# Patient Record
Sex: Female | Born: 1950 | Race: White | Hispanic: No | Smoking: Former smoker
Health system: Southern US, Community
[De-identification: ages and names within clinical notes are randomized; demographics above are authoritative.]

## PROBLEM LIST (undated history)

## (undated) DIAGNOSIS — I10 Essential (primary) hypertension: Secondary | ICD-10-CM

## (undated) DIAGNOSIS — F988 Other specified behavioral and emotional disorders with onset usually occurring in childhood and adolescence: Secondary | ICD-10-CM

## (undated) DIAGNOSIS — Z8489 Family history of other specified conditions: Secondary | ICD-10-CM

## (undated) DIAGNOSIS — M8580 Other specified disorders of bone density and structure, unspecified site: Secondary | ICD-10-CM

## (undated) DIAGNOSIS — Z87410 Personal history of cervical dysplasia: Secondary | ICD-10-CM

## (undated) DIAGNOSIS — F419 Anxiety disorder, unspecified: Secondary | ICD-10-CM

## (undated) DIAGNOSIS — N879 Dysplasia of cervix uteri, unspecified: Secondary | ICD-10-CM

## (undated) HISTORY — DX: Other specified disorders of bone density and structure, unspecified site: M85.80

## (undated) HISTORY — PX: BREAST SURGERY: SHX581

## (undated) HISTORY — PX: APPENDECTOMY: SHX54

## (undated) HISTORY — PX: GYNECOLOGIC CRYOSURGERY: SHX857

## (undated) HISTORY — DX: Anxiety disorder, unspecified: F41.9

## (undated) HISTORY — DX: Other specified behavioral and emotional disorders with onset usually occurring in childhood and adolescence: F98.8

## (undated) HISTORY — DX: Dysplasia of cervix uteri, unspecified: N87.9

## (undated) HISTORY — PX: HERNIA REPAIR: SHX51

## (undated) HISTORY — DX: Essential (primary) hypertension: I10

## (undated) HISTORY — PX: BREAST EXCISIONAL BIOPSY: SUR124

## (undated) HISTORY — PX: COLPOSCOPY: SHX161

---

## 1998-09-30 ENCOUNTER — Encounter (INDEPENDENT_AMBULATORY_CARE_PROVIDER_SITE_OTHER): Payer: Self-pay | Admitting: Specialist

## 1998-09-30 ENCOUNTER — Other Ambulatory Visit: Admission: RE | Admit: 1998-09-30 | Discharge: 1998-09-30 | Payer: Self-pay | Admitting: Obstetrics and Gynecology

## 1999-06-30 ENCOUNTER — Encounter: Admission: RE | Admit: 1999-06-30 | Discharge: 1999-06-30 | Payer: Self-pay | Admitting: Obstetrics and Gynecology

## 1999-06-30 ENCOUNTER — Encounter: Payer: Self-pay | Admitting: Obstetrics and Gynecology

## 1999-08-08 ENCOUNTER — Encounter: Admission: RE | Admit: 1999-08-08 | Discharge: 1999-08-08 | Payer: Self-pay | Admitting: Urology

## 1999-08-08 ENCOUNTER — Encounter: Payer: Self-pay | Admitting: Urology

## 2000-04-06 ENCOUNTER — Other Ambulatory Visit: Admission: RE | Admit: 2000-04-06 | Discharge: 2000-04-06 | Payer: Self-pay | Admitting: Obstetrics and Gynecology

## 2000-04-06 ENCOUNTER — Encounter: Admission: RE | Admit: 2000-04-06 | Discharge: 2000-04-06 | Payer: Self-pay | Admitting: Obstetrics and Gynecology

## 2000-04-06 ENCOUNTER — Encounter: Payer: Self-pay | Admitting: Obstetrics and Gynecology

## 2000-04-06 ENCOUNTER — Encounter (INDEPENDENT_AMBULATORY_CARE_PROVIDER_SITE_OTHER): Payer: Self-pay | Admitting: Specialist

## 2000-10-01 ENCOUNTER — Encounter: Admission: RE | Admit: 2000-10-01 | Discharge: 2000-10-01 | Payer: Self-pay | Admitting: Obstetrics and Gynecology

## 2000-10-01 ENCOUNTER — Encounter: Payer: Self-pay | Admitting: Obstetrics and Gynecology

## 2000-10-09 ENCOUNTER — Encounter (INDEPENDENT_AMBULATORY_CARE_PROVIDER_SITE_OTHER): Payer: Self-pay | Admitting: Specialist

## 2000-10-09 ENCOUNTER — Encounter: Admission: RE | Admit: 2000-10-09 | Discharge: 2000-10-09 | Payer: Self-pay | Admitting: General Surgery

## 2000-10-09 ENCOUNTER — Ambulatory Visit (HOSPITAL_BASED_OUTPATIENT_CLINIC_OR_DEPARTMENT_OTHER): Admission: RE | Admit: 2000-10-09 | Discharge: 2000-10-09 | Payer: Self-pay | Admitting: General Surgery

## 2000-10-09 ENCOUNTER — Encounter: Payer: Self-pay | Admitting: General Surgery

## 2001-04-30 ENCOUNTER — Encounter: Payer: Self-pay | Admitting: Obstetrics and Gynecology

## 2001-04-30 ENCOUNTER — Encounter: Admission: RE | Admit: 2001-04-30 | Discharge: 2001-04-30 | Payer: Self-pay | Admitting: Obstetrics and Gynecology

## 2002-05-05 ENCOUNTER — Other Ambulatory Visit: Admission: RE | Admit: 2002-05-05 | Discharge: 2002-05-05 | Payer: Self-pay | Admitting: Obstetrics and Gynecology

## 2002-05-13 ENCOUNTER — Ambulatory Visit (HOSPITAL_COMMUNITY): Admission: RE | Admit: 2002-05-13 | Discharge: 2002-05-13 | Payer: Self-pay | Admitting: Obstetrics and Gynecology

## 2002-05-13 ENCOUNTER — Encounter: Payer: Self-pay | Admitting: Obstetrics and Gynecology

## 2002-08-21 ENCOUNTER — Encounter: Admission: RE | Admit: 2002-08-21 | Discharge: 2002-08-21 | Payer: Self-pay | Admitting: Obstetrics and Gynecology

## 2002-08-21 ENCOUNTER — Encounter: Payer: Self-pay | Admitting: Obstetrics and Gynecology

## 2003-01-12 ENCOUNTER — Encounter: Payer: Self-pay | Admitting: Family Medicine

## 2003-01-12 ENCOUNTER — Encounter: Admission: RE | Admit: 2003-01-12 | Discharge: 2003-01-12 | Payer: Self-pay | Admitting: Family Medicine

## 2003-05-18 ENCOUNTER — Other Ambulatory Visit: Admission: RE | Admit: 2003-05-18 | Discharge: 2003-05-18 | Payer: Self-pay | Admitting: Obstetrics and Gynecology

## 2003-07-22 ENCOUNTER — Other Ambulatory Visit: Admission: RE | Admit: 2003-07-22 | Discharge: 2003-07-22 | Payer: Self-pay | Admitting: Obstetrics and Gynecology

## 2003-09-07 ENCOUNTER — Encounter: Admission: RE | Admit: 2003-09-07 | Discharge: 2003-09-07 | Payer: Self-pay | Admitting: Obstetrics and Gynecology

## 2004-05-24 ENCOUNTER — Other Ambulatory Visit: Admission: RE | Admit: 2004-05-24 | Discharge: 2004-05-24 | Payer: Self-pay | Admitting: Obstetrics and Gynecology

## 2004-10-21 ENCOUNTER — Encounter: Admission: RE | Admit: 2004-10-21 | Discharge: 2004-10-21 | Payer: Self-pay | Admitting: Obstetrics and Gynecology

## 2005-09-13 ENCOUNTER — Other Ambulatory Visit: Admission: RE | Admit: 2005-09-13 | Discharge: 2005-09-13 | Payer: Self-pay | Admitting: Obstetrics and Gynecology

## 2006-01-02 ENCOUNTER — Encounter: Admission: RE | Admit: 2006-01-02 | Discharge: 2006-01-02 | Payer: Self-pay | Admitting: Obstetrics and Gynecology

## 2006-02-18 ENCOUNTER — Emergency Department (HOSPITAL_COMMUNITY): Admission: EM | Admit: 2006-02-18 | Discharge: 2006-02-18 | Payer: Self-pay | Admitting: Emergency Medicine

## 2006-09-20 ENCOUNTER — Other Ambulatory Visit: Admission: RE | Admit: 2006-09-20 | Discharge: 2006-09-20 | Payer: Self-pay | Admitting: Obstetrics and Gynecology

## 2006-11-08 ENCOUNTER — Inpatient Hospital Stay (HOSPITAL_COMMUNITY): Admission: EM | Admit: 2006-11-08 | Discharge: 2006-11-09 | Payer: Self-pay | Admitting: Emergency Medicine

## 2006-11-08 ENCOUNTER — Ambulatory Visit: Payer: Self-pay | Admitting: Internal Medicine

## 2006-11-09 ENCOUNTER — Encounter (INDEPENDENT_AMBULATORY_CARE_PROVIDER_SITE_OTHER): Payer: Self-pay | Admitting: Internal Medicine

## 2006-12-22 ENCOUNTER — Emergency Department (HOSPITAL_COMMUNITY): Admission: EM | Admit: 2006-12-22 | Discharge: 2006-12-22 | Payer: Self-pay | Admitting: Emergency Medicine

## 2007-02-14 ENCOUNTER — Encounter: Admission: RE | Admit: 2007-02-14 | Discharge: 2007-02-14 | Payer: Self-pay | Admitting: Obstetrics and Gynecology

## 2007-10-16 ENCOUNTER — Other Ambulatory Visit: Admission: RE | Admit: 2007-10-16 | Discharge: 2007-10-16 | Payer: Self-pay | Admitting: Obstetrics and Gynecology

## 2008-03-06 ENCOUNTER — Encounter: Admission: RE | Admit: 2008-03-06 | Discharge: 2008-03-06 | Payer: Self-pay | Admitting: Obstetrics and Gynecology

## 2008-11-02 ENCOUNTER — Ambulatory Visit: Payer: Self-pay | Admitting: Obstetrics and Gynecology

## 2008-11-02 ENCOUNTER — Encounter: Payer: Self-pay | Admitting: Obstetrics and Gynecology

## 2008-11-02 ENCOUNTER — Other Ambulatory Visit: Admission: RE | Admit: 2008-11-02 | Discharge: 2008-11-02 | Payer: Self-pay | Admitting: Obstetrics and Gynecology

## 2008-11-23 ENCOUNTER — Ambulatory Visit: Payer: Self-pay | Admitting: Obstetrics and Gynecology

## 2009-04-08 ENCOUNTER — Encounter: Admission: RE | Admit: 2009-04-08 | Discharge: 2009-04-08 | Payer: Self-pay | Admitting: Obstetrics and Gynecology

## 2009-11-17 ENCOUNTER — Other Ambulatory Visit: Admission: RE | Admit: 2009-11-17 | Discharge: 2009-11-17 | Payer: Self-pay | Admitting: Obstetrics and Gynecology

## 2009-11-17 ENCOUNTER — Ambulatory Visit: Payer: Self-pay | Admitting: Obstetrics and Gynecology

## 2010-01-20 ENCOUNTER — Ambulatory Visit: Payer: Self-pay | Admitting: Obstetrics and Gynecology

## 2010-04-19 ENCOUNTER — Other Ambulatory Visit: Payer: Self-pay | Admitting: Obstetrics and Gynecology

## 2010-04-19 DIAGNOSIS — Z1239 Encounter for other screening for malignant neoplasm of breast: Secondary | ICD-10-CM

## 2010-05-03 ENCOUNTER — Ambulatory Visit
Admission: RE | Admit: 2010-05-03 | Discharge: 2010-05-03 | Disposition: A | Discharge status: Home / Self Care | Payer: BC Managed Care – PPO | Source: Ambulatory Visit | Attending: Obstetrics and Gynecology | Admitting: Obstetrics and Gynecology

## 2010-05-03 DIAGNOSIS — Z1239 Encounter for other screening for malignant neoplasm of breast: Secondary | ICD-10-CM

## 2010-08-09 NOTE — Discharge Summary (Signed)
NAME:  Friebel, Jevon                ACCOUNT NO.:  [id number]   MEDICAL RECORD NO.:  [id number]          PATIENT TYPE:  INP   LOCATION:  1402                         FACILITY:  [facility]   PHYSICIAN:  Barnetta Chapel, MDDATE OF BIRTH:  14-Apr-1950   DATE OF ADMISSION:  11/08/2006  DATE OF DISCHARGE:  11/09/2006                               DISCHARGE SUMMARY   PRIMARY CARE PHYSICIAN:  Dr. Doran Clay   DISCHARGE DIAGNOSES:  1. Urinary tract infection.  2. Hypotension, resolved.  3. Syncope, resolved.  4. Dehydration, resolved.   DISCHARGE MEDICATIONS:  1. Cipro 500 mg p.o. b.i.d. x5 days.  2. Luvox 200 mg p.o. once daily.  3. Ritalin 20 mg p.o. once daily (the primary care Milon Dethloff should      please reassess need to continue prescribing Ritalin)  4. Benicar is on hold.   Imaging studies done include CT scan of the brain which did not show any  acute abnormalities.  Echocardiogram (echocardiogram report is still  pending.  The primary care Redding Cloe should please follow echocardiogram  report).   PERTINENT LABS:  The UA revealed positive nitrites.  Urine cultures are  still pending.  The primary care Frances Joynt should please follow the  results of the urine culture.  The patient will be discharged home on  Cipro 500 mg p.o. twice daily x5 days.   HISTORY AND HOSPITAL COURSE:  Please refer to the H&P done on November 08, 2006.  The patient is a 60 year old female with past medical history  significant for hypertension and attention deficit disorder and anxiety.  The patient presented to the hospital with a syncopal episode and low  blood pressure.  UA done in the ER revealed a likely UTI.  The patient  was also dehydrated on admission.   The patient was admitted to the telemetry floor.  Reviewed the telemetry  records, and the patient has been in sinus rhythm since admission.  No  further syncopal episodes.  The patient was adequately rehydrated.  Benicar was held.  The  patient was started on IV Cipro 400 mg twice  daily.  Urine cultures are still pending.  There will be need for  primary care Pinkey Mcjunkin to follow up the result of urine culture.   No further syncopal episodes.  The patient feels she is back to her  normal self.  Blood pressure on discharge was 94/59 mmHg.  We will  continue to hold Benicar.  The primary care Hilda Rynders should please  reassess the need and when to restart Benicar.   DISCHARGE PLANS:  1. Discharge home today.  2. Follow up with the primary care Christell Steinmiller in a week.  3. Cipro 500 mg b.i.d. x5 days.  4. The patient has been advised to check her blood pressure every day      and call the primary care Tequia Wolman with the result.  5. Cardiac diet.  6. Activity as tolerated.  7. The primary care Buford Gayler should please reassess need to continue      Ritalin.      Barnetta Chapel, MD  Electronically  Signed     SIO/MEDQ  D:  11/09/2006  T:  11/09/2006  Job:  [id number]   cc:   Al Decant. Janey Greaser, MD  Fax: [phone]

## 2010-08-09 NOTE — H&P (Signed)
NAME:  Karen Hancock, Karen Hancock                ACCOUNT NO.:  [id number]   MEDICAL RECORD NO.:  [id number]          PATIENT TYPE:  EMS   LOCATION:  ED                           FACILITY:  [facility]   PHYSICIAN:  Barnetta Chapel, MDDATE OF BIRTH:  [birth year]   DATE OF ADMISSION:  11/08/2006  DATE OF DISCHARGE:                              HISTORY & PHYSICAL   PRIMARY CARE PHYSICIAN:  Dr. Doran Clay.   PRESENTING COMPLAINT:  Syncopal episode.   HISTORY OF PRESENT ILLNESS:  The patient is a 60 year old female with  past medical history significant for hypertension, ADD (attention  deficit disorder) and anxiety.  The patient was brought to the emergency  room following a syncopal episode at the cancer center.  The patient  escorted her friend to have chemotherapy at the oncology center.  While  at the oncology center, the patient's felt woozy  and passed out.  She  is not sure how long she was out for.  She denied any chest pain,  and  has never had a history of chest pain.  She denies any past history of  seizure disorder and she also denied any post ictal symptoms or signs.  She denied associated nausea or vomiting.  She reported being  comfortable with the friend at the oncology unit prior to the syncopal  episode.  No fever or chills.  No headache, neck pain, sore throat,  chest pain, shortness of breath, GI symptoms, or urinary symptoms.  The  UA has revealed positive nitrites.  White count is 6.4.  Blood pressure  on presentation to the ER was 86/84mmhg.  BUN was 22 and creatinine was  0.82 on admission.   PAST MEDICAL HISTORY:  1. Hypertension.  2. Anxiety.  3. Attention deficit disorder.   MEDICATIONS:  Medications prior to admission includes:  1. Benicar.  The patient is not sure of the dose.  2. Ritalin 20 mg p.o. one daily.   ALLERGIES:  None.   SOCIAL HISTORY:  The patient is divorced, has two children.  She denied  any use of cigarettes or illicit drugs.  She drinks  alcohol  occasionally.  She works as a Loss adjuster, chartered in [employer] and denied  any financial problems.   FAMILY HISTORY:  Significant for dementia and myocardial infarction.   REVIEW OF SYSTEMS:  Essentially as above.   PHYSICAL EXAMINATION:  VITAL SIGNS:  Temperature 98.1.  Blood pressure  on presentation to the ER was 86/60 but now 98/66 with IV fluid  resuscitation.  The patient heart rate is 70 and the respiratory rate is  16.  GENERAL:  The patient is not in any overt distress.  HEENT:  No pallor, no jaundice.  Extraocular muscles intact.  NECK:  Supple.  There is no JVD or lymphadenopathy.  LUNGS:  Clear to auscultation.  CARDIAC:  S1 and S2 normal.  No murmur appreciated.  ABDOMEN:  Soft, nontender.  Organs were not palpable.  Bowel sounds are  present.  NEUROLOGIC:  Nonfocal and the patient was alert.  EXTREMITIES:  No edema.   LABORATORY DATA:  White count 6.4, hemoglobin 13.8, hematocrit 40, MCV  90.2, platelet count 263.  Urinalysis is positive nitrites.  Shows  moderate leukocyte esterase.  Sodium 138, potassium 3.3, chloride 104,  CO2 27, glucose 125, BUN 22, creatinine 0.82.  Calcium 8.9, total  protein 7.1, albumin 3.7, AST 21, ALT 23.   IMPRESSION:  1. Syncope.  2. Urinary tract infection.  3. Hypokalemia.  4. Hypotension.  5. Dehydration.   PLAN:  Will admit the patient to telemetry floor.  Start the patient on  IV Cipro for the urinary tract infection.  Will culture the patient  urine and blood.  Will hold antihypertensive for now. Will rehydrate the  patient.  Will correct hypokalemia.  Will get an echocardiogram.  The  emergency room is already arranging for a CT scan of the brain.  Further  management will depend on hospital course.      Barnetta Chapel, MD  Electronically Signed     SIO/MEDQ  D:  11/08/2006  T:  11/09/2006  Job:  [id number]   cc:   Al Decant. Janey Greaser, MD  Fax: [phone]

## 2010-08-12 NOTE — Op Note (Signed)
[place]  Patient:    Karen Hancock, Karen Hancock                       MRN: [id number] Proc. Date: 10/09/00 Adm. Date:  [id number] Attending:  Janalyn Rouse CC:         Rande Brunt. Eda Paschal, M.D.   Operative Report  PREOPERATIVE DIAGNOSIS:  Left breast mass, probably benign.  POSTOPERATIVE DIAGNOSIS:  Left breast mass, probably benign.  PROCEDURE:  Left breast biopsy with needle localization and specimen mammography.  SURGEON:  Rose Phi. Maple Hudson, M.D.  ANESTHESIA:  MAC.  DESCRIPTION OF PROCEDURE:  Patient placed on the operating table and the left breast prepped and draped in the usual fashion.  The wire had been placed coming from medial to lateral with an X on the spot over the breast, centered at about the 4 oclock position, which was where her palpable nodule and the other nodule were identified.  A curvilinear incision centered on that position was then outlined with a marking pencil.  The area was infiltrated with 1% Xylocaine with adrenalin.  Incision was made, and we opened up the breast tissue a bit and then delivered the wire into the middle of the incision, then widely excised around the wire.  Specimen mammography confirmed the removal of the lesions.  There were no other palpable abnormalities. There were a number of little microcysts, however, in the breast tissue.  Hemostasis obtained with the cautery.  Subcuticular closure of 4-0 Monocryl and Steri-Strips carried out.  Specimen mammography confirmed the removal of the lesion.  Dressing applied and the patient transferred to the recovery room in satisfactory condition, having tolerated the procedure well. DD:  10/09/00 TD:  10/09/00 Job: [id number]

## 2010-11-10 ENCOUNTER — Encounter: Payer: Self-pay | Admitting: Gynecology

## 2010-11-10 DIAGNOSIS — F419 Anxiety disorder, unspecified: Secondary | ICD-10-CM | POA: Insufficient documentation

## 2010-11-10 DIAGNOSIS — M8580 Other specified disorders of bone density and structure, unspecified site: Secondary | ICD-10-CM | POA: Insufficient documentation

## 2010-11-10 DIAGNOSIS — I10 Essential (primary) hypertension: Secondary | ICD-10-CM | POA: Insufficient documentation

## 2010-11-10 DIAGNOSIS — F988 Other specified behavioral and emotional disorders with onset usually occurring in childhood and adolescence: Secondary | ICD-10-CM | POA: Insufficient documentation

## 2010-11-22 ENCOUNTER — Ambulatory Visit (INDEPENDENT_AMBULATORY_CARE_PROVIDER_SITE_OTHER): Payer: BC Managed Care – PPO | Admitting: Obstetrics and Gynecology

## 2010-11-22 ENCOUNTER — Other Ambulatory Visit (HOSPITAL_COMMUNITY)
Admission: RE | Admit: 2010-11-22 | Discharge: 2010-11-22 | Disposition: A | Discharge status: Home / Self Care | Payer: BC Managed Care – PPO | Source: Ambulatory Visit | Attending: Obstetrics and Gynecology | Admitting: Obstetrics and Gynecology

## 2010-11-22 ENCOUNTER — Encounter: Payer: Self-pay | Admitting: Obstetrics and Gynecology

## 2010-11-22 VITALS — BP 114/66 | Ht 67.5 in | Wt 138.0 lb

## 2010-11-22 DIAGNOSIS — Z01419 Encounter for gynecological examination (general) (routine) without abnormal findings: Secondary | ICD-10-CM | POA: Insufficient documentation

## 2010-11-22 DIAGNOSIS — M949 Disorder of cartilage, unspecified: Secondary | ICD-10-CM

## 2010-11-22 DIAGNOSIS — M8580 Other specified disorders of bone density and structure, unspecified site: Secondary | ICD-10-CM

## 2010-11-22 DIAGNOSIS — R82998 Other abnormal findings in urine: Secondary | ICD-10-CM

## 2010-11-22 DIAGNOSIS — M899 Disorder of bone, unspecified: Secondary | ICD-10-CM

## 2010-11-22 NOTE — Progress Notes (Signed)
Addended byCammie Mcgee T on: 11/22/2010 11:45 AM   Modules accepted: Orders

## 2010-11-22 NOTE — Progress Notes (Signed)
The patient came back to see me today for an annual GYN exam. She is doing fine without any menopausal symptoms. Her PCP does her lab work. She lost 45 pounds with [employer]. She is up-to-date on mammograms. She is low bone mass and is due for bone density the fall of 2013. She has had no fractures.  Physical examination: HEENT within normal limits. Neck: Thyroid not large. No masses. Supraclavicular nodes: not enlarged. Breasts: Examined in both sitting midline position. No skin changes and no masses. Abdomen: Soft no guarding rebound or masses or hernia. Pelvic: External: Within normal limits. BUS: Within normal limits. Vaginal:within normal limits. Good estrogen effect. No evidence of cystocele rectocele or enterocele. Cervix: clean. Uterus: Normal size and shape. Adnexa: No masses. Rectovaginal exam: Confirmatory and negative. Extremities: Within normal limits.  Assessment: Normal GYN exam, low bone mass  Plan: Continue yearly mammograms, followed bone density next year.

## 2010-11-23 ENCOUNTER — Telehealth: Payer: Self-pay

## 2010-11-23 DIAGNOSIS — N390 Urinary tract infection, site not specified: Secondary | ICD-10-CM

## 2010-11-23 MED ORDER — CIPROFLOXACIN HCL 250 MG PO TABS: 250.0000 mg | ORAL_TABLET | Freq: Two times a day (BID) | ORAL | Status: AC

## 2010-11-23 NOTE — Telephone Encounter (Signed)
Message copied by Venora Maples on Wed Nov 23, 2010 11:28 AM ------      Message from: Trellis Paganini      Created: Wed Nov 23, 2010 10:30 AM       Tell patient that once again she has a UTI. Please treat with Cipro 250 mg twice a day for 7 days. Patient needs followup urine culture in a week.

## 2010-11-23 NOTE — Telephone Encounter (Signed)
PT. NOTIFIED OF DR. G'S NOTE & I PUT ORDERS & APPT IN PC AND SENT RX TO HER [facility]

## 2010-12-08 ENCOUNTER — Other Ambulatory Visit: Payer: BC Managed Care – PPO | Admitting: *Deleted

## 2010-12-08 DIAGNOSIS — N390 Urinary tract infection, site not specified: Secondary | ICD-10-CM

## 2010-12-16 ENCOUNTER — Other Ambulatory Visit: Payer: Self-pay

## 2010-12-16 DIAGNOSIS — R8271 Bacteriuria: Secondary | ICD-10-CM

## 2010-12-22 ENCOUNTER — Ambulatory Visit: Payer: BC Managed Care – PPO | Admitting: *Deleted

## 2010-12-22 DIAGNOSIS — R8271 Bacteriuria: Secondary | ICD-10-CM

## 2010-12-25 LAB — URINE CULTURE: Colony Count: >=100000

## 2010-12-27 ENCOUNTER — Other Ambulatory Visit: Payer: Self-pay | Admitting: *Deleted

## 2010-12-27 MED ORDER — AMOXICILLIN 250 MG PO CAPS: 250.0000 mg | ORAL_CAPSULE | Freq: Three times a day (TID) | ORAL | Status: AC

## 2011-01-05 LAB — I-STAT 8, (EC8 V) (CONVERTED LAB)
Acid-base deficit: 2
Bicarbonate: 24.7 — ABNORMAL HIGH
Chloride: 109
Hemoglobin: 14.3
Operator id: 285491
Potassium: 4.3
Sodium: 142
pCO2, Ven: 46.6
pH, Ven: 7.333 — ABNORMAL HIGH

## 2011-01-09 LAB — URINALYSIS, ROUTINE W REFLEX MICROSCOPIC
Bilirubin Urine: NEGATIVE
Protein, ur: NEGATIVE
Specific Gravity, Urine: 1.019
Urobilinogen, UA: 0.2

## 2011-01-09 LAB — URINE CULTURE: Colony Count: >=100000

## 2011-01-09 LAB — COMPREHENSIVE METABOLIC PANEL
Albumin: 3.7
Calcium: 8.9
Chloride: 104
Creatinine, Ser: 0.82
GFR calc Af Amer: >60
Glucose, Bld: 125 — ABNORMAL HIGH
Total Protein: 7.1

## 2011-01-09 LAB — CBC
HCT: 40
Hemoglobin: 13.8
MCV: 90.2
Platelets: 263
WBC: 6.4

## 2011-01-09 LAB — URINE MICROSCOPIC-ADD ON

## 2011-01-09 LAB — DIFFERENTIAL
Basophils Absolute: 0
Basophils Relative: 0
Eosinophils Absolute: 0.2
Eosinophils Relative: 3
Lymphs Abs: 2
Monocytes Relative: 6
Neutrophils Relative %: 59

## 2011-01-09 LAB — D-DIMER, QUANTITATIVE: D-Dimer, Quant: 0.34

## 2011-01-09 LAB — CK TOTAL AND CKMB (NOT AT ARMC)
CK, MB: 0.7
Relative Index: INVALID
Total CK: 51

## 2011-01-09 LAB — POCT CARDIAC MARKERS
CKMB, poc: <1 — ABNORMAL LOW
Troponin i, poc: <0.05

## 2011-01-09 LAB — TROPONIN I: Troponin I: 0.01

## 2011-01-18 ENCOUNTER — Other Ambulatory Visit: Payer: BC Managed Care – PPO | Admitting: *Deleted

## 2011-01-18 DIAGNOSIS — R8271 Bacteriuria: Secondary | ICD-10-CM

## 2011-06-05 ENCOUNTER — Other Ambulatory Visit: Payer: Self-pay | Admitting: Obstetrics and Gynecology

## 2011-06-05 DIAGNOSIS — Z1231 Encounter for screening mammogram for malignant neoplasm of breast: Secondary | ICD-10-CM

## 2011-06-29 ENCOUNTER — Ambulatory Visit
Admission: RE | Admit: 2011-06-29 | Discharge: 2011-06-29 | Disposition: A | Discharge status: Home / Self Care | Payer: BC Managed Care – PPO | Source: Ambulatory Visit | Attending: Obstetrics and Gynecology | Admitting: Obstetrics and Gynecology

## 2011-06-29 DIAGNOSIS — Z1231 Encounter for screening mammogram for malignant neoplasm of breast: Secondary | ICD-10-CM

## 2011-12-21 ENCOUNTER — Encounter: Payer: BC Managed Care – PPO | Admitting: Obstetrics and Gynecology

## 2012-01-01 ENCOUNTER — Encounter: Payer: BC Managed Care – PPO | Admitting: Obstetrics and Gynecology

## 2012-01-02 ENCOUNTER — Encounter: Payer: Self-pay | Admitting: Obstetrics and Gynecology

## 2012-01-02 ENCOUNTER — Ambulatory Visit (INDEPENDENT_AMBULATORY_CARE_PROVIDER_SITE_OTHER): Payer: BC Managed Care – PPO | Admitting: Obstetrics and Gynecology

## 2012-01-02 ENCOUNTER — Other Ambulatory Visit (HOSPITAL_COMMUNITY)
Admission: RE | Admit: 2012-01-02 | Discharge: 2012-01-02 | Disposition: A | Discharge status: Home / Self Care | Payer: BC Managed Care – PPO | Source: Ambulatory Visit | Attending: Obstetrics and Gynecology | Admitting: Obstetrics and Gynecology

## 2012-01-02 VITALS — BP 120/76 | Ht 68.0 in | Wt 134.0 lb

## 2012-01-02 DIAGNOSIS — Z01419 Encounter for gynecological examination (general) (routine) without abnormal findings: Secondary | ICD-10-CM | POA: Insufficient documentation

## 2012-01-02 DIAGNOSIS — N390 Urinary tract infection, site not specified: Secondary | ICD-10-CM

## 2012-01-02 DIAGNOSIS — N879 Dysplasia of cervix uteri, unspecified: Secondary | ICD-10-CM | POA: Insufficient documentation

## 2012-01-02 LAB — URINALYSIS W MICROSCOPIC + REFLEX CULTURE
Bilirubin Urine: NEGATIVE
Ketones, ur: NEGATIVE mg/dL
Nitrite: NEGATIVE
Protein, ur: NEGATIVE mg/dL
Urobilinogen, UA: 0.2 mg/dL (ref 0.0–1.0)

## 2012-01-02 NOTE — Patient Instructions (Signed)
Schedule bone density.    

## 2012-01-02 NOTE — Progress Notes (Signed)
Patient came to see me today for her annual GYN exam. She is doing fine without HRT. She was treated for cervical dysplasia with cryosurgery greater than 20 years ago. She has had normal yearly Pap smears. She had a normal Pap smear last year. She is doing well without hormone replacement. She had a normal mammogram this year. Her last bone density was 2011 and showed low bone mass without an elevated fracture risk. She has had no fractures. She has a long history of urinary tract infections. At one point she saw Dr. Logan Bores many years ago and all was normal. This year she's been treated twice for urinary tract infection over short period of time by Dr. Hyacinth Meeker. This was within the last month. She does not notice a sexual relationship to be infections. She is having no vaginal bleeding. She is having no pelvic pain.  Physical examination:kim Julian Reil present. HEENT within normal limits. Neck: Thyroid not large. No masses. Supraclavicular nodes: not enlarged. Breasts: Examined in both sitting and lying  position. No skin changes and no masses. Abdomen: Soft no guarding rebound or masses or hernia. Pelvic: External: Within normal limits. BUS: Within normal limits. Vaginal:within normal limits. Good estrogen effect. No evidence of cystocele rectocele or enterocele. Cervix: clean. Uterus: Normal size and shape. Adnexa: No masses. Rectovaginal exam: Confirmatory and negative. Extremities: Within normal limits.  Assessment: #1. Osteopenia #2. Recurrent urinary tract infections #3. Cervical dysplasia  Plan: Scheduled bone density. Continue yearly mammograms. Patient had an abnormal urinalysis today. She will get me her last urine culture and the two antibiotics she has been on. We may need to refer her  for workup of microscopic hematuria. We will first  see about the infection. If we have to refer  her we will have her see Dr. Patsi Sears. The new Pap smear guidelines were discussed with the patient. Patient  requested pap and was done.

## 2012-01-04 LAB — URINE CULTURE
Colony Count: NO GROWTH
Organism ID, Bacteria: NO GROWTH

## 2012-01-05 ENCOUNTER — Other Ambulatory Visit: Payer: Self-pay | Admitting: Obstetrics and Gynecology

## 2012-01-05 DIAGNOSIS — R3129 Other microscopic hematuria: Secondary | ICD-10-CM

## 2012-01-09 ENCOUNTER — Encounter: Payer: Self-pay | Admitting: Obstetrics and Gynecology

## 2012-01-12 ENCOUNTER — Other Ambulatory Visit: Payer: BC Managed Care – PPO

## 2012-01-12 DIAGNOSIS — R3129 Other microscopic hematuria: Secondary | ICD-10-CM

## 2012-01-13 LAB — URINALYSIS W MICROSCOPIC + REFLEX CULTURE
Hgb urine dipstick: NEGATIVE
Nitrite: NEGATIVE
Protein, ur: NEGATIVE mg/dL
Urobilinogen, UA: 0.2 mg/dL (ref 0.0–1.0)

## 2012-01-15 ENCOUNTER — Other Ambulatory Visit: Payer: Self-pay | Admitting: Obstetrics and Gynecology

## 2012-01-15 DIAGNOSIS — R3129 Other microscopic hematuria: Secondary | ICD-10-CM

## 2012-01-15 LAB — URINE CULTURE: Colony Count: 60000

## 2012-01-31 ENCOUNTER — Other Ambulatory Visit: Payer: Self-pay | Admitting: Obstetrics and Gynecology

## 2012-01-31 DIAGNOSIS — M8580 Other specified disorders of bone density and structure, unspecified site: Secondary | ICD-10-CM

## 2012-02-01 ENCOUNTER — Ambulatory Visit (INDEPENDENT_AMBULATORY_CARE_PROVIDER_SITE_OTHER): Payer: BC Managed Care – PPO

## 2012-02-01 DIAGNOSIS — M899 Disorder of bone, unspecified: Secondary | ICD-10-CM

## 2012-02-01 DIAGNOSIS — M8580 Other specified disorders of bone density and structure, unspecified site: Secondary | ICD-10-CM

## 2012-02-02 ENCOUNTER — Ambulatory Visit (INDEPENDENT_AMBULATORY_CARE_PROVIDER_SITE_OTHER): Payer: BC Managed Care – PPO | Admitting: Gynecology

## 2012-02-02 ENCOUNTER — Encounter: Payer: Self-pay | Admitting: Gynecology

## 2012-02-02 DIAGNOSIS — N390 Urinary tract infection, site not specified: Secondary | ICD-10-CM

## 2012-02-02 DIAGNOSIS — R300 Dysuria: Secondary | ICD-10-CM

## 2012-02-02 LAB — URINALYSIS W MICROSCOPIC + REFLEX CULTURE
Glucose, UA: NEGATIVE mg/dL
Protein, ur: NEGATIVE mg/dL
Urobilinogen, UA: 0.2 mg/dL (ref 0.0–1.0)

## 2012-02-02 MED ORDER — SULFAMETHOXAZOLE-TMP DS 800-160 MG PO TABS: 1.0000 | ORAL_TABLET | Freq: Two times a day (BID) | ORAL | Status: DC

## 2012-02-02 MED ORDER — NITROFURANTOIN MONOHYD MACRO 100 MG PO CAPS: 100.0000 mg | ORAL_CAPSULE | Freq: Once | ORAL | Status: DC | PRN

## 2012-02-02 NOTE — Patient Instructions (Signed)
Take Septra DS one by mouth twice a day x7 days. Take Macrodantin antibiotic one pill after intercourse.  Follow up if any recurrent symptoms.

## 2012-02-02 NOTE — Progress Notes (Signed)
Patient presents complaining of frequency and dysuria over the past several days.  Has a history of recurrent UTIs and in fact had been seen by a urologist a number of years ago. On questioning she does think some of these seem related to intercourse. No fever chills nausea vomiting diarrhea or other constitutional symptoms.  Exam Spine straight no CVA tenderness Abdomen soft nontender without masses guarding rebound organomegaly  Urinalysis 21-50 WBC, 7-10 RBC, many bacteria, few epithelial  Assessment and plan: UTI with history of recurrences. Acutely treat with Septra DS 1 by mouth twice a day x7 days. As some seem to follow intercourse discussed options to include postcoital prophylaxis with Macrobid 100 mg #31 refill.  Patient agrees to try we'll see how she does with this. Postcoital voiding also encouraged. I did recommend she repeat a urinalysis in a month or so as a baseline and the patient agrees.

## 2012-02-06 LAB — URINE CULTURE: Colony Count: >=100000

## 2012-02-26 ENCOUNTER — Other Ambulatory Visit: Payer: Self-pay | Admitting: Obstetrics and Gynecology

## 2012-02-26 ENCOUNTER — Telehealth: Payer: Self-pay | Admitting: Obstetrics and Gynecology

## 2012-02-26 DIAGNOSIS — R300 Dysuria: Secondary | ICD-10-CM

## 2012-02-26 NOTE — Telephone Encounter (Signed)
Patient thinks she has another UTI.  Ok now not in bad pain or anything.  Patient questioning what you recommend and does she need to see urologist?

## 2012-02-26 NOTE — Telephone Encounter (Signed)
Has burning at end of urination.

## 2012-02-26 NOTE — Telephone Encounter (Signed)
Just leave urine.

## 2012-02-26 NOTE — Telephone Encounter (Signed)
Should this include an office visit or okay to just come in and leave urine for urinalysis?

## 2012-02-26 NOTE — Telephone Encounter (Signed)
Why does she think she has a urinary tract infection?

## 2012-02-26 NOTE — Telephone Encounter (Signed)
Have patient come in for urinalysis. If she does have an infection we can treat and then she should see a urologist.

## 2012-02-26 NOTE — Telephone Encounter (Signed)
Patient advised. She will come by in the am.  Order and lab appt put in system.

## 2012-02-27 ENCOUNTER — Other Ambulatory Visit: Payer: BC Managed Care – PPO

## 2012-02-27 DIAGNOSIS — R300 Dysuria: Secondary | ICD-10-CM

## 2012-02-28 LAB — URINALYSIS W MICROSCOPIC + REFLEX CULTURE
Bilirubin Urine: NEGATIVE
Ketones, ur: NEGATIVE mg/dL
Protein, ur: NEGATIVE mg/dL
Urobilinogen, UA: 0.2 mg/dL (ref 0.0–1.0)

## 2012-02-29 ENCOUNTER — Other Ambulatory Visit: Payer: Self-pay | Admitting: Obstetrics and Gynecology

## 2012-02-29 DIAGNOSIS — N390 Urinary tract infection, site not specified: Secondary | ICD-10-CM

## 2012-02-29 LAB — URINE CULTURE

## 2012-02-29 MED ORDER — AMOXICILLIN 250 MG PO CAPS: 250.0000 mg | ORAL_CAPSULE | Freq: Three times a day (TID) | ORAL | Status: DC

## 2012-03-04 ENCOUNTER — Other Ambulatory Visit: Payer: Self-pay | Admitting: Obstetrics and Gynecology

## 2012-03-04 ENCOUNTER — Telehealth: Payer: Self-pay | Admitting: Obstetrics and Gynecology

## 2012-03-04 DIAGNOSIS — N390 Urinary tract infection, site not specified: Secondary | ICD-10-CM

## 2012-03-04 NOTE — Telephone Encounter (Signed)
You are treating patient for recurrent UTI currently.  You had recommended she see Urologist and have follow-up u/a there as well.  Patient said she could not get appt until Jan 8th and wonders if she needs to return to [facility] before then for [facility] u/a or okay to wait.

## 2012-03-04 NOTE — Telephone Encounter (Signed)
Return here for treatment of cure but see urologist in January.

## 2012-03-12 ENCOUNTER — Other Ambulatory Visit: Payer: BC Managed Care – PPO

## 2012-03-12 DIAGNOSIS — N390 Urinary tract infection, site not specified: Secondary | ICD-10-CM

## 2012-03-13 LAB — URINALYSIS W MICROSCOPIC + REFLEX CULTURE
Nitrite: POSITIVE — AB
Protein, ur: NEGATIVE mg/dL

## 2012-03-15 ENCOUNTER — Other Ambulatory Visit: Payer: Self-pay | Admitting: *Deleted

## 2012-03-15 LAB — URINE CULTURE: Colony Count: >=100000

## 2012-03-15 MED ORDER — CIPROFLOXACIN HCL 250 MG PO TABS: 250.0000 mg | ORAL_TABLET | Freq: Two times a day (BID) | ORAL | Status: DC

## 2012-06-05 ENCOUNTER — Other Ambulatory Visit: Payer: Self-pay

## 2012-06-05 DIAGNOSIS — Z1231 Encounter for screening mammogram for malignant neoplasm of breast: Secondary | ICD-10-CM

## 2012-06-21 ENCOUNTER — Other Ambulatory Visit: Payer: Self-pay | Admitting: *Deleted

## 2012-06-21 DIAGNOSIS — R319 Hematuria, unspecified: Secondary | ICD-10-CM

## 2012-06-21 NOTE — Progress Notes (Signed)
Dr Reece Agar pt - his order

## 2012-07-11 ENCOUNTER — Ambulatory Visit
Admission: RE | Admit: 2012-07-11 | Discharge: 2012-07-11 | Disposition: A | Discharge status: Home / Self Care | Payer: BC Managed Care – PPO | Source: Ambulatory Visit

## 2012-07-11 DIAGNOSIS — Z1231 Encounter for screening mammogram for malignant neoplasm of breast: Secondary | ICD-10-CM

## 2012-10-18 ENCOUNTER — Telehealth: Payer: Self-pay

## 2012-10-18 NOTE — Telephone Encounter (Signed)
Patient would like to know what kind of shot she got when she came here in 2010 (chart was at 104) please call patient and let her know at [phone]

## 2012-10-23 NOTE — Telephone Encounter (Signed)
Based on info in [place], patient had a t-dap in 2010. Chart in storage. If she needs a copy she needs to sign an ROI form from our website or come into the office. Left this information on voicemail.

## 2013-05-20 ENCOUNTER — Encounter: Payer: Self-pay | Admitting: Emergency Medicine

## 2013-05-20 ENCOUNTER — Ambulatory Visit (INDEPENDENT_AMBULATORY_CARE_PROVIDER_SITE_OTHER): Payer: BC Managed Care – PPO | Admitting: Emergency Medicine

## 2013-05-20 VITALS — BP 106/84 | HR 104 | Temp 97.7°F | Resp 16

## 2013-05-20 DIAGNOSIS — S0990XA Unspecified injury of head, initial encounter: Secondary | ICD-10-CM

## 2013-05-20 DIAGNOSIS — S0100XA Unspecified open wound of scalp, initial encounter: Secondary | ICD-10-CM

## 2013-05-20 DIAGNOSIS — S0101XA Laceration without foreign body of scalp, initial encounter: Secondary | ICD-10-CM

## 2013-05-20 NOTE — Progress Notes (Signed)
   Subjective:    Patient ID: Karen Hancock, female    DOB: [birth year], 63 y.o.   MRN: [id number]  HPI 63 yo female with complaint of fall and hit head on cement rooster in sun room.  Patient denies LOC.  Occurred just prior to arrival. Pain at site, but no global headache at present.  She reports tetanus up to date.  She has no neck pain.  No other injuries.  Slipped as cause of fall.  PPMH:  Hyperdension, ADD  SH:  Former smoker, no alcohol.   Review of Systems  Constitutional: Negative for fever and chills.  Gastrointestinal: Negative for nausea and vomiting.  Musculoskeletal: Negative for back pain, gait problem and neck pain.  Neurological: Negative for weakness, numbness and headaches.       Objective:   Physical Exam There were no vitals taken for this visit. There is no weight on file to calculate BMI. Well-developed, well nourished female who is awake, alert and oriented, in NAD. HEENT: 3 cm laceration to posterior scalp, PERRL, EOMI.  Sclera and conjunctiva are clear.  EAC are patent, TMs are normal in appearance. Nasal mucosa is pink and moist. OP is clear. Neck: supple, non-tender, no lymphadenopathy, thyromegaly. Heart: RRR, no murmur Lungs: normal effort, CTA Abdomen: normo-active bowel sounds, supple, non-tender, no mass or organomegaly. Extremities: no cyanosis, clubbing or edema. Skin: warm and dry without rash. Psychologic: good mood and appropriate affect, normal speech and behavior.     Assessment & Plan:  Scalp laceration, closed with staples.  Procedure note:   Patient's wound closed with staples after washed with normal saline, anesthetized with 2% lido with epi. # 6 staples placed today.  Patient tolerated the procedure well.  Patient to return in 5-7 days for staple removal.  Advised of precautions and signs of intracranial bleeding.  Patient to return or go to ED if these appear.

## 2013-05-20 NOTE — Patient Instructions (Signed)
Laceration Care, Adult A laceration is a cut or lesion that goes through all layers of the skin and into the tissue just beneath the skin. TREATMENT  Some lacerations may not require closure. Some lacerations may not be able to be closed due to an increased risk of infection. It is important to see your caregiver as soon as possible after an injury to minimize the risk of infection and maximize the opportunity for successful closure. If closure is appropriate, pain medicines may be given, if needed. The wound will be cleaned to help prevent infection. Your caregiver will use stitches (sutures), staples, wound glue (adhesive), or skin adhesive strips to repair the laceration. These tools bring the skin edges together to allow for faster healing and a better cosmetic outcome. However, all wounds will heal with a scar. Once the wound has healed, scarring can be minimized by covering the wound with sunscreen during the day for 1 full year. HOME CARE INSTRUCTIONS  For sutures or staples:  Keep the wound clean and dry.  If you were given a bandage (dressing), you should change it at least once a day. Also, change the dressing if it becomes wet or dirty, or as directed by your caregiver.  Wash the wound with soap and water 2 times a day. Rinse the wound off with water to remove all soap. Pat the wound dry with a clean towel.  After cleaning, apply a thin layer of the antibiotic ointment as recommended by your caregiver. This will help prevent infection and keep the dressing from sticking.  You may shower as usual after the first 24 hours. Do not soak the wound in water until the sutures are removed.  Only take over-the-counter or prescription medicines for pain, discomfort, or fever as directed by your caregiver.  Get your sutures or staples removed as directed by your caregiver. For skin adhesive strips:  Keep the wound clean and dry.  Do not get the skin adhesive strips wet. You may bathe  carefully, using caution to keep the wound dry.  If the wound gets wet, pat it dry with a clean towel.  Skin adhesive strips will fall off on their own. You may trim the strips as the wound heals. Do not remove skin adhesive strips that are still stuck to the wound. They will fall off in time. For wound adhesive:  You may briefly wet your wound in the shower or bath. Do not soak or scrub the wound. Do not swim. Avoid periods of heavy perspiration until the skin adhesive has fallen off on its own. After showering or bathing, gently pat the wound dry with a clean towel.  Do not apply liquid medicine, cream medicine, or ointment medicine to your wound while the skin adhesive is in place. This may loosen the film before your wound is healed.  If a dressing is placed over the wound, be careful not to apply tape directly over the skin adhesive. This may cause the adhesive to be pulled off before the wound is healed.  Avoid prolonged exposure to sunlight or tanning lamps while the skin adhesive is in place. Exposure to ultraviolet light in the first year will darken the scar.  The skin adhesive will usually remain in place for 5 to 10 days, then naturally fall off the skin. Do not pick at the adhesive film. You may need a tetanus shot if:  You cannot remember when you had your last tetanus shot.  You have never had a tetanus  shot. If you get a tetanus shot, your arm may swell, get red, and feel warm to the touch. This is common and not a problem. If you need a tetanus shot and you choose not to have one, there is a rare chance of getting tetanus. Sickness from tetanus can be serious. SEEK MEDICAL CARE IF:   You have redness, swelling, or increasing pain in the wound.  You see a red line that goes away from the wound.  You have yellowish-white fluid (pus) coming from the wound.  You have a fever.  You notice a bad smell coming from the wound or dressing.  Your wound breaks open before or  after sutures have been removed.  You notice something coming out of the wound such as wood or glass.  Your wound is on your hand or foot and you cannot move a finger or toe. SEEK IMMEDIATE MEDICAL CARE IF:   Your pain is not controlled with prescribed medicine.  You have severe swelling around the wound causing pain and numbness or a change in color in your arm, hand, leg, or foot.  Your wound splits open and starts bleeding.  You have worsening numbness, weakness, or loss of function of any joint around or beyond the wound.  You develop painful lumps near the wound or on the skin anywhere on your body. MAKE SURE YOU:   Understand these instructions.  Will watch your condition.  Will get help right away if you are not doing well or get worse. Document Released: 03/13/2005 Document Revised: 06/05/2011 Document Reviewed: 09/06/2010 [facility] Patient Information 2014 [place], [place].  Staple Care and Removal Your caregiver has used staples today to repair your wound. Staples are used to help a wound heal faster by holding the edges of the wound together. The staples can be removed when the wound has healed well enough to stay together after the staples are removed. A dressing (wound covering), depending on the location of the wound, may have been applied. This may be changed once per day or as instructed. If the dressing sticks, it may be soaked off with soapy water or hydrogen peroxide. Only take over-the-counter or prescription medicines for pain, discomfort, or fever as directed by your caregiver.  If you did not receive a tetanus shot today because you did not recall when your last one was given, check with your caregiver when you have your staples removed to determine if one is needed. Return to your caregiver's office in 1 week or as suggested to have your staples removed. SEEK IMMEDIATE MEDICAL CARE IF:   You have redness, swelling, or increasing pain in the wound.  You have  pus coming from the wound.  You have a fever.  You notice a bad smell coming from the wound or dressing.  Your wound edges break open after staples have been removed. Document Released: 12/06/2000 Document Revised: 06/05/2011 Document Reviewed: 12/21/2004 [facility] Patient Information 2014 [place].

## 2013-05-27 ENCOUNTER — Ambulatory Visit (INDEPENDENT_AMBULATORY_CARE_PROVIDER_SITE_OTHER): Payer: BC Managed Care – PPO | Admitting: Physician Assistant

## 2013-05-27 VITALS — BP 120/76 | HR 102 | Temp 98.0°F | Resp 16 | Ht 67.0 in | Wt 147.0 lb

## 2013-05-27 DIAGNOSIS — S0990XA Unspecified injury of head, initial encounter: Secondary | ICD-10-CM

## 2013-05-27 DIAGNOSIS — S0100XA Unspecified open wound of scalp, initial encounter: Secondary | ICD-10-CM

## 2013-05-27 NOTE — Progress Notes (Signed)
   Subjective:    Patient ID: Karen Hancock, female    DOB: December 30, 1950, 63 y.o.   MRN: [id number]  Suture / Staple Removal   63 year old female presents for staple removal.  DOI 05/20/13. Doing well without any issues or complaints.  Denies erythema, warmth, or drainage.  Patient has no other concerns today.     Review of Systems  Constitutional: Negative for fever and chills.  Gastrointestinal: Negative for nausea and vomiting.  Skin: Positive for wound. Negative for color change.  Neurological: Negative for dizziness and headaches.       Objective:   Physical Exam  Constitutional: She is oriented to person, place, and time. She appears well-developed and well-nourished.  HENT:  Head: Normocephalic and atraumatic.  Right Ear: External ear normal.  Left Ear: External ear normal.  Eyes: Conjunctivae are normal.  Neck: Normal range of motion.  Cardiovascular: Normal rate.   Pulmonary/Chest: Effort normal.  Neurological: She is alert and oriented to person, place, and time.  Skin:     Noted area has well healing wound with staples in place. No erythema, warmth, or drainage.   Psychiatric: She has a normal mood and affect. Her behavior is normal. Judgment and thought content normal.     Staples removed without difficulty. Patient tolerated well.      Assessment & Plan:  Open wound of scalp, without mention of complication  Staples removed.  Wound well healing Follow up as needed.

## 2013-07-02 ENCOUNTER — Other Ambulatory Visit: Payer: Self-pay

## 2013-07-02 DIAGNOSIS — Z1231 Encounter for screening mammogram for malignant neoplasm of breast: Secondary | ICD-10-CM

## 2013-07-18 ENCOUNTER — Ambulatory Visit
Admission: RE | Admit: 2013-07-18 | Discharge: 2013-07-18 | Disposition: A | Discharge status: Home / Self Care | Payer: BC Managed Care – PPO | Source: Ambulatory Visit

## 2013-07-18 DIAGNOSIS — Z1231 Encounter for screening mammogram for malignant neoplasm of breast: Secondary | ICD-10-CM

## 2013-10-20 ENCOUNTER — Other Ambulatory Visit (HOSPITAL_COMMUNITY)
Admission: RE | Admit: 2013-10-20 | Discharge: 2013-10-20 | Disposition: A | Discharge status: Home / Self Care | Payer: BC Managed Care – PPO | Source: Ambulatory Visit | Attending: Family Medicine | Admitting: Family Medicine

## 2013-10-20 ENCOUNTER — Other Ambulatory Visit: Payer: Self-pay | Admitting: Family Medicine

## 2013-10-20 DIAGNOSIS — Z1151 Encounter for screening for human papillomavirus (HPV): Secondary | ICD-10-CM | POA: Insufficient documentation

## 2013-10-20 DIAGNOSIS — Z124 Encounter for screening for malignant neoplasm of cervix: Secondary | ICD-10-CM | POA: Insufficient documentation

## 2013-10-22 LAB — CYTOLOGY - PAP

## 2013-12-10 ENCOUNTER — Other Ambulatory Visit: Payer: Self-pay | Admitting: Gastroenterology

## 2014-01-26 ENCOUNTER — Encounter: Payer: Self-pay | Admitting: Emergency Medicine

## 2014-06-26 ENCOUNTER — Other Ambulatory Visit: Payer: Self-pay

## 2014-06-26 DIAGNOSIS — Z1231 Encounter for screening mammogram for malignant neoplasm of breast: Secondary | ICD-10-CM

## 2014-07-30 ENCOUNTER — Ambulatory Visit: Payer: Self-pay

## 2014-08-14 ENCOUNTER — Ambulatory Visit
Admission: RE | Admit: 2014-08-14 | Discharge: 2014-08-14 | Disposition: A | Discharge status: Home / Self Care | Payer: BLUE CROSS/BLUE SHIELD | Source: Ambulatory Visit

## 2014-08-14 DIAGNOSIS — Z1231 Encounter for screening mammogram for malignant neoplasm of breast: Secondary | ICD-10-CM

## 2015-05-12 ENCOUNTER — Encounter (HOSPITAL_COMMUNITY): Payer: BLUE CROSS/BLUE SHIELD

## 2015-07-27 ENCOUNTER — Other Ambulatory Visit: Payer: Self-pay

## 2015-07-27 DIAGNOSIS — Z1231 Encounter for screening mammogram for malignant neoplasm of breast: Secondary | ICD-10-CM

## 2015-08-31 ENCOUNTER — Ambulatory Visit
Admission: RE | Admit: 2015-08-31 | Discharge: 2015-08-31 | Disposition: A | Discharge status: Home / Self Care | Payer: BLUE CROSS/BLUE SHIELD | Source: Ambulatory Visit

## 2015-08-31 DIAGNOSIS — Z1231 Encounter for screening mammogram for malignant neoplasm of breast: Secondary | ICD-10-CM

## 2016-02-22 ENCOUNTER — Other Ambulatory Visit: Payer: Self-pay | Admitting: Dermatology

## 2016-04-06 DIAGNOSIS — L570 Actinic keratosis: Secondary | ICD-10-CM | POA: Diagnosis not present

## 2016-04-06 DIAGNOSIS — L905 Scar conditions and fibrosis of skin: Secondary | ICD-10-CM | POA: Diagnosis not present

## 2016-04-06 DIAGNOSIS — Z85828 Personal history of other malignant neoplasm of skin: Secondary | ICD-10-CM | POA: Diagnosis not present

## 2016-06-19 DIAGNOSIS — N3020 Other chronic cystitis without hematuria: Secondary | ICD-10-CM | POA: Diagnosis not present

## 2016-07-12 DIAGNOSIS — L905 Scar conditions and fibrosis of skin: Secondary | ICD-10-CM | POA: Diagnosis not present

## 2016-08-17 ENCOUNTER — Other Ambulatory Visit: Payer: Self-pay | Admitting: Family Medicine

## 2016-08-17 DIAGNOSIS — Z1231 Encounter for screening mammogram for malignant neoplasm of breast: Secondary | ICD-10-CM

## 2016-09-04 DIAGNOSIS — L814 Other melanin hyperpigmentation: Secondary | ICD-10-CM | POA: Diagnosis not present

## 2016-09-04 DIAGNOSIS — L820 Inflamed seborrheic keratosis: Secondary | ICD-10-CM | POA: Diagnosis not present

## 2016-09-07 ENCOUNTER — Ambulatory Visit
Admission: RE | Admit: 2016-09-07 | Discharge: 2016-09-07 | Disposition: A | Discharge status: Home / Self Care | Payer: Medicare Other | Source: Ambulatory Visit | Attending: Family Medicine | Admitting: Family Medicine

## 2016-09-07 DIAGNOSIS — Z1231 Encounter for screening mammogram for malignant neoplasm of breast: Secondary | ICD-10-CM | POA: Diagnosis not present

## 2016-10-19 DIAGNOSIS — N3020 Other chronic cystitis without hematuria: Secondary | ICD-10-CM | POA: Diagnosis not present

## 2016-10-30 DIAGNOSIS — Z0000 Encounter for general adult medical examination without abnormal findings: Secondary | ICD-10-CM | POA: Diagnosis not present

## 2016-10-30 DIAGNOSIS — M810 Age-related osteoporosis without current pathological fracture: Secondary | ICD-10-CM | POA: Diagnosis not present

## 2016-10-30 DIAGNOSIS — Z1159 Encounter for screening for other viral diseases: Secondary | ICD-10-CM | POA: Diagnosis not present

## 2016-10-30 DIAGNOSIS — H6122 Impacted cerumen, left ear: Secondary | ICD-10-CM | POA: Diagnosis not present

## 2016-10-30 DIAGNOSIS — K635 Polyp of colon: Secondary | ICD-10-CM | POA: Diagnosis not present

## 2016-10-30 DIAGNOSIS — F329 Major depressive disorder, single episode, unspecified: Secondary | ICD-10-CM | POA: Diagnosis not present

## 2016-10-30 DIAGNOSIS — Z87891 Personal history of nicotine dependence: Secondary | ICD-10-CM | POA: Diagnosis not present

## 2016-10-30 DIAGNOSIS — I10 Essential (primary) hypertension: Secondary | ICD-10-CM | POA: Diagnosis not present

## 2016-10-30 DIAGNOSIS — N3020 Other chronic cystitis without hematuria: Secondary | ICD-10-CM | POA: Diagnosis not present

## 2016-10-30 DIAGNOSIS — Z23 Encounter for immunization: Secondary | ICD-10-CM | POA: Diagnosis not present

## 2016-12-11 DIAGNOSIS — M810 Age-related osteoporosis without current pathological fracture: Secondary | ICD-10-CM | POA: Diagnosis not present

## 2017-02-02 DIAGNOSIS — E785 Hyperlipidemia, unspecified: Secondary | ICD-10-CM | POA: Diagnosis not present

## 2017-02-22 DIAGNOSIS — D229 Melanocytic nevi, unspecified: Secondary | ICD-10-CM | POA: Diagnosis not present

## 2017-02-22 DIAGNOSIS — L821 Other seborrheic keratosis: Secondary | ICD-10-CM | POA: Diagnosis not present

## 2017-02-22 DIAGNOSIS — L814 Other melanin hyperpigmentation: Secondary | ICD-10-CM | POA: Diagnosis not present

## 2017-02-22 DIAGNOSIS — L570 Actinic keratosis: Secondary | ICD-10-CM | POA: Diagnosis not present

## 2017-02-23 ENCOUNTER — Ambulatory Visit: Payer: Self-pay | Admitting: Internal Medicine

## 2017-02-23 ENCOUNTER — Encounter: Payer: Self-pay | Admitting: Internal Medicine

## 2017-02-23 DIAGNOSIS — Z789 Other specified health status: Secondary | ICD-10-CM

## 2017-02-23 DIAGNOSIS — Z9189 Other specified personal risk factors, not elsewhere classified: Secondary | ICD-10-CM

## 2017-02-23 DIAGNOSIS — Z7184 Encounter for health counseling related to travel: Secondary | ICD-10-CM | POA: Insufficient documentation

## 2017-02-23 DIAGNOSIS — Z23 Encounter for immunization: Secondary | ICD-10-CM

## 2017-02-23 DIAGNOSIS — Z7189 Other specified counseling: Secondary | ICD-10-CM

## 2017-02-23 MED ORDER — CIPROFLOXACIN HCL 500 MG PO TABS: 500.0000 mg | ORAL_TABLET | Freq: Two times a day (BID) | ORAL | 0 refills | Status: DC

## 2017-02-23 MED ORDER — ATOVAQUONE-PROGUANIL HCL 250-100 MG PO TABS: 1.0000 | ORAL_TABLET | Freq: Every day | ORAL | 0 refills | Status: DC

## 2017-02-23 NOTE — Patient Instructions (Signed)
[facility]                [address] [place]                   [place], [place] [address]                      Phone: [phone]                        Fax: [phone]   Planned departure date: January 2019          Planned return date: 12 days Countries of travel: [place]   Guidelines for the Prevention & Treatment of Traveler's Diarrhea  Prevention: "Boil it, Peel it, Lacinda Axon it, or Forget it"   the fewer chances -> lower risk: try to stick to food & water precautions as much as possible"   If it's "piping hot"; it is probably okay, if not, it may not be   Treatment   1) You should always take care to drink lots of fluids in order to avoid dehydration   2) You should bring medications with you in case you come down with a case of diarrhea   3) OTC = bring pepto-bismol - can take with initial abdominal symptoms;                    Imodium - can help slow down your intestinal tract, can help relief cramps                    and diarrhea, can take if no bloody diarrhea  Use ciprofloxacin if needed for traveler's diarrhea  Guidelines for the Prevention of Malaria  Avoidance:  -fewer mosquito bites = lower risk. Mosquitos can bite at night as well as daytime  -cover up (long sleeve clothing), mosquito nets, screens  -Insect repellent for your skin ( DEET containing lotion > 20%): for clothes ( permethrin spray)   2 days prior to travel, start malarone, daily dose starting 1-2 days before entering endemic area, ending 7 days after leaving area for malaria prevention.   Immunizations received today: Hepatitis A series, Typhoid (parenteral), Yellow Fever and make sure Tetanus with pertussis is up to date, influenza vaccine  Future immunizations, if indicated Hepatitis A series between June and December 2019 get #2   Prior to travel:  1) Be sure to pick up appropriate prescriptions, including medicine you take daily. Do not  expect to be able to fill your prescriptions abroad.  2) Strongly consider obtaining traveler's insurance, including emergency evacuation insurance. Most plans in the [place] do not cover participants abroad. (see below for resources)  3) Register at the appropriate [place] embassy or consulate with travel dates so they are aware of your presence in-country and for helpful advice during travel using the [employer] (STEP, GreenNylon.com.cy).  4) Leave contact information with a relative or friend.  5) Keep a Research officer, political party, credit cards in case they become lost or stolen  6) Inform your credit card company that you will be travelling abroad   During travel:  1) If you become ill and need medical advice, the [place] [employer]	[place] of the country you are traveling in general provides a list of [place] speaking doctors.  We are also available on MyChart for remote consultation if you register prior to travel.  2) Avoid motorcycles or scooters when at all possible. Traffic laws in many countries are lax and accidents occur frequently.  3) Do not take any unnecessary risks that you wouldn't do at home.   Resources:  -Country specific information: BlindResource.ca or GreenNylon.com.cy  -Press photographer (DEET, mosquito nets): REI, [employer] store, [employer], [place] insurance options: gatewayplans.com; http://clayton-rivera.info/; travelguard.com or Good Pilgr[employer] Pride, gninsurance.com or info@gninsurance .com, [id number].   Post Travel:  If you return from your trip ill, call your primary care doctor or our travel clinic @ [phone].   Enjoy your trip and know that with proper pre-travel preparation, most people have an enjoyable and uninterrupted trip!

## 2017-02-23 NOTE — Progress Notes (Signed)
Subjective:   Karen Hancock is a 66 y.o. female who presents to the Infectious Disease clinic for travel consultation. Planned departure date: January 2019          Planned return date: 12 days Countries of travel: [place] Areas in country: urban   Accommodations: hotel Purpose of travel: [place] work Prior travel out of [place]: no     Objective:   Medications: reviewed    Assessment:   No contraindications to travel. none     Plan:    Issues discussed: altitude illness, environmental concerns, freshwater swimming, future shots, malaria, motion sickness, MVA safety, rabies, safe food/water, traveler's diarrhea, website/handouts for more information, what to do if ill upon return, what to do if ill while there and Yellow Fever. Immunizations recommended: Hepatitis A series, Typhoid (parenteral) and Yellow Fever. Malaria prophylaxis: malarone, daily dose starting 1-2 days before entering endemic area, ending 7 days after leaving area Traveler's diarrhea prophylaxis: ciprofloxacin. Total duration of visit: 1 Hour. Total time spent on education, counseling, coordination of care: 30 Minutes.

## 2017-02-23 NOTE — Addendum Note (Signed)
Addended by: Landis Gandy on: 02/23/2017 11:28 AM   Modules accepted: Orders

## 2017-02-27 DIAGNOSIS — Z23 Encounter for immunization: Secondary | ICD-10-CM | POA: Diagnosis not present

## 2017-04-03 DIAGNOSIS — N3020 Other chronic cystitis without hematuria: Secondary | ICD-10-CM | POA: Diagnosis not present

## 2017-04-08 DIAGNOSIS — J069 Acute upper respiratory infection, unspecified: Secondary | ICD-10-CM | POA: Diagnosis not present

## 2017-07-04 DIAGNOSIS — F411 Generalized anxiety disorder: Secondary | ICD-10-CM | POA: Diagnosis not present

## 2017-07-04 DIAGNOSIS — F3341 Major depressive disorder, recurrent, in partial remission: Secondary | ICD-10-CM | POA: Diagnosis not present

## 2017-07-31 DIAGNOSIS — N3020 Other chronic cystitis without hematuria: Secondary | ICD-10-CM | POA: Diagnosis not present

## 2017-08-08 ENCOUNTER — Telehealth: Payer: Self-pay

## 2017-08-08 ENCOUNTER — Other Ambulatory Visit: Payer: Self-pay | Admitting: Family Medicine

## 2017-08-08 DIAGNOSIS — Z1231 Encounter for screening mammogram for malignant neoplasm of breast: Secondary | ICD-10-CM

## 2017-08-08 NOTE — Telephone Encounter (Signed)
Pt called today asking for an appt to get 2nd Hep A vaccine at our office. Pt is scheduled for May 31st at 9 am.  Aundria Rud, [place]

## 2017-08-16 DIAGNOSIS — F3341 Major depressive disorder, recurrent, in partial remission: Secondary | ICD-10-CM | POA: Diagnosis not present

## 2017-08-16 DIAGNOSIS — F411 Generalized anxiety disorder: Secondary | ICD-10-CM | POA: Diagnosis not present

## 2017-08-23 ENCOUNTER — Telehealth: Payer: Self-pay

## 2017-08-23 NOTE — Telephone Encounter (Signed)
Called pt to reschedule nurse visit for Hep A #2 due to conference. Pt was understanding and will reschedule for next Thursday.  [place]

## 2017-08-24 ENCOUNTER — Ambulatory Visit: Payer: Medicare Other

## 2017-08-30 ENCOUNTER — Ambulatory Visit (INDEPENDENT_AMBULATORY_CARE_PROVIDER_SITE_OTHER): Payer: Medicare Other | Admitting: *Deleted

## 2017-08-30 DIAGNOSIS — Z7189 Other specified counseling: Secondary | ICD-10-CM | POA: Diagnosis not present

## 2017-08-30 DIAGNOSIS — Z9189 Other specified personal risk factors, not elsewhere classified: Secondary | ICD-10-CM | POA: Diagnosis not present

## 2017-08-30 DIAGNOSIS — Z0000 Encounter for general adult medical examination without abnormal findings: Secondary | ICD-10-CM

## 2017-08-30 DIAGNOSIS — Z7184 Encounter for health counseling related to travel: Secondary | ICD-10-CM

## 2017-08-30 DIAGNOSIS — Z23 Encounter for immunization: Secondary | ICD-10-CM | POA: Diagnosis not present

## 2017-08-30 DIAGNOSIS — Z7185 Encounter for immunization safety counseling: Secondary | ICD-10-CM

## 2017-09-10 ENCOUNTER — Ambulatory Visit
Admission: RE | Admit: 2017-09-10 | Discharge: 2017-09-10 | Disposition: A | Discharge status: Home / Self Care | Payer: Medicare Other | Source: Ambulatory Visit | Attending: Family Medicine | Admitting: Family Medicine

## 2017-09-10 DIAGNOSIS — Z1231 Encounter for screening mammogram for malignant neoplasm of breast: Secondary | ICD-10-CM

## 2017-09-11 ENCOUNTER — Other Ambulatory Visit: Payer: Self-pay | Admitting: Family Medicine

## 2017-09-11 DIAGNOSIS — R928 Other abnormal and inconclusive findings on diagnostic imaging of breast: Secondary | ICD-10-CM

## 2017-09-13 ENCOUNTER — Ambulatory Visit
Admission: RE | Admit: 2017-09-13 | Discharge: 2017-09-13 | Disposition: A | Discharge status: Home / Self Care | Payer: Medicare Other | Source: Ambulatory Visit | Attending: Family Medicine | Admitting: Family Medicine

## 2017-09-13 ENCOUNTER — Other Ambulatory Visit: Payer: Self-pay | Admitting: Family Medicine

## 2017-09-13 DIAGNOSIS — N6489 Other specified disorders of breast: Secondary | ICD-10-CM | POA: Diagnosis not present

## 2017-09-13 DIAGNOSIS — R928 Other abnormal and inconclusive findings on diagnostic imaging of breast: Secondary | ICD-10-CM

## 2017-09-18 ENCOUNTER — Ambulatory Visit
Admission: RE | Admit: 2017-09-18 | Discharge: 2017-09-18 | Disposition: A | Discharge status: Home / Self Care | Payer: Medicare Other | Source: Ambulatory Visit | Attending: Family Medicine | Admitting: Family Medicine

## 2017-09-18 ENCOUNTER — Other Ambulatory Visit: Payer: Self-pay | Admitting: Family Medicine

## 2017-09-18 DIAGNOSIS — N6489 Other specified disorders of breast: Secondary | ICD-10-CM

## 2017-09-18 DIAGNOSIS — R928 Other abnormal and inconclusive findings on diagnostic imaging of breast: Secondary | ICD-10-CM

## 2017-10-23 DIAGNOSIS — F411 Generalized anxiety disorder: Secondary | ICD-10-CM | POA: Diagnosis not present

## 2017-10-23 DIAGNOSIS — F3341 Major depressive disorder, recurrent, in partial remission: Secondary | ICD-10-CM | POA: Diagnosis not present

## 2017-11-14 DIAGNOSIS — Z6823 Body mass index (BMI) 23.0-23.9, adult: Secondary | ICD-10-CM | POA: Diagnosis not present

## 2017-11-14 DIAGNOSIS — M25512 Pain in left shoulder: Secondary | ICD-10-CM | POA: Diagnosis not present

## 2017-11-14 DIAGNOSIS — Z0000 Encounter for general adult medical examination without abnormal findings: Secondary | ICD-10-CM | POA: Diagnosis not present

## 2017-11-14 DIAGNOSIS — M810 Age-related osteoporosis without current pathological fracture: Secondary | ICD-10-CM | POA: Diagnosis not present

## 2017-11-14 DIAGNOSIS — Z23 Encounter for immunization: Secondary | ICD-10-CM | POA: Diagnosis not present

## 2017-11-14 DIAGNOSIS — F325 Major depressive disorder, single episode, in full remission: Secondary | ICD-10-CM | POA: Diagnosis not present

## 2017-11-14 DIAGNOSIS — E785 Hyperlipidemia, unspecified: Secondary | ICD-10-CM | POA: Diagnosis not present

## 2017-11-14 DIAGNOSIS — F5101 Primary insomnia: Secondary | ICD-10-CM | POA: Diagnosis not present

## 2017-11-14 DIAGNOSIS — I10 Essential (primary) hypertension: Secondary | ICD-10-CM | POA: Diagnosis not present

## 2017-12-05 DIAGNOSIS — H2513 Age-related nuclear cataract, bilateral: Secondary | ICD-10-CM | POA: Diagnosis not present

## 2017-12-05 DIAGNOSIS — H5203 Hypermetropia, bilateral: Secondary | ICD-10-CM | POA: Diagnosis not present

## 2017-12-05 DIAGNOSIS — H524 Presbyopia: Secondary | ICD-10-CM | POA: Diagnosis not present

## 2017-12-05 DIAGNOSIS — H52223 Regular astigmatism, bilateral: Secondary | ICD-10-CM | POA: Diagnosis not present

## 2017-12-14 DIAGNOSIS — Z23 Encounter for immunization: Secondary | ICD-10-CM | POA: Diagnosis not present

## 2018-01-07 DIAGNOSIS — D2261 Melanocytic nevi of right upper limb, including shoulder: Secondary | ICD-10-CM | POA: Diagnosis not present

## 2018-01-07 DIAGNOSIS — L570 Actinic keratosis: Secondary | ICD-10-CM | POA: Diagnosis not present

## 2018-01-07 DIAGNOSIS — C44519 Basal cell carcinoma of skin of other part of trunk: Secondary | ICD-10-CM | POA: Diagnosis not present

## 2018-01-07 DIAGNOSIS — D225 Melanocytic nevi of trunk: Secondary | ICD-10-CM | POA: Diagnosis not present

## 2018-01-07 DIAGNOSIS — L821 Other seborrheic keratosis: Secondary | ICD-10-CM | POA: Diagnosis not present

## 2018-01-07 DIAGNOSIS — D1801 Hemangioma of skin and subcutaneous tissue: Secondary | ICD-10-CM | POA: Diagnosis not present

## 2018-01-07 DIAGNOSIS — D2272 Melanocytic nevi of left lower limb, including hip: Secondary | ICD-10-CM | POA: Diagnosis not present

## 2018-01-07 DIAGNOSIS — C44719 Basal cell carcinoma of skin of left lower limb, including hip: Secondary | ICD-10-CM | POA: Diagnosis not present

## 2018-01-07 DIAGNOSIS — D2262 Melanocytic nevi of left upper limb, including shoulder: Secondary | ICD-10-CM | POA: Diagnosis not present

## 2018-01-24 DIAGNOSIS — F3341 Major depressive disorder, recurrent, in partial remission: Secondary | ICD-10-CM | POA: Diagnosis not present

## 2018-01-24 DIAGNOSIS — F411 Generalized anxiety disorder: Secondary | ICD-10-CM | POA: Diagnosis not present

## 2018-02-19 DIAGNOSIS — Z85828 Personal history of other malignant neoplasm of skin: Secondary | ICD-10-CM | POA: Diagnosis not present

## 2018-02-19 DIAGNOSIS — C44519 Basal cell carcinoma of skin of other part of trunk: Secondary | ICD-10-CM | POA: Diagnosis not present

## 2018-02-28 DIAGNOSIS — M25551 Pain in right hip: Secondary | ICD-10-CM | POA: Diagnosis not present

## 2018-03-12 DIAGNOSIS — M25551 Pain in right hip: Secondary | ICD-10-CM | POA: Diagnosis not present

## 2018-03-12 DIAGNOSIS — M7061 Trochanteric bursitis, right hip: Secondary | ICD-10-CM | POA: Diagnosis not present

## 2018-03-25 ENCOUNTER — Ambulatory Visit: Admission: RE | Admit: 2018-03-25 | Payer: Medicare Other | Source: Ambulatory Visit

## 2018-03-25 ENCOUNTER — Other Ambulatory Visit: Payer: Self-pay | Admitting: Family Medicine

## 2018-03-25 ENCOUNTER — Ambulatory Visit
Admission: RE | Admit: 2018-03-25 | Discharge: 2018-03-25 | Disposition: A | Discharge status: Home / Self Care | Payer: Medicare Other | Source: Ambulatory Visit | Attending: Family Medicine | Admitting: Family Medicine

## 2018-03-25 DIAGNOSIS — R928 Other abnormal and inconclusive findings on diagnostic imaging of breast: Secondary | ICD-10-CM

## 2018-03-25 DIAGNOSIS — N6489 Other specified disorders of breast: Secondary | ICD-10-CM

## 2018-03-29 DIAGNOSIS — M25551 Pain in right hip: Secondary | ICD-10-CM | POA: Diagnosis not present

## 2018-04-01 DIAGNOSIS — M25551 Pain in right hip: Secondary | ICD-10-CM | POA: Diagnosis not present

## 2018-04-05 DIAGNOSIS — M25551 Pain in right hip: Secondary | ICD-10-CM | POA: Diagnosis not present

## 2018-04-09 DIAGNOSIS — M25551 Pain in right hip: Secondary | ICD-10-CM | POA: Diagnosis not present

## 2018-04-11 DIAGNOSIS — M25551 Pain in right hip: Secondary | ICD-10-CM | POA: Diagnosis not present

## 2018-04-16 DIAGNOSIS — M25551 Pain in right hip: Secondary | ICD-10-CM | POA: Diagnosis not present

## 2018-04-19 DIAGNOSIS — M25551 Pain in right hip: Secondary | ICD-10-CM | POA: Diagnosis not present

## 2018-04-22 DIAGNOSIS — M25551 Pain in right hip: Secondary | ICD-10-CM | POA: Diagnosis not present

## 2018-04-23 DIAGNOSIS — M7061 Trochanteric bursitis, right hip: Secondary | ICD-10-CM | POA: Diagnosis not present

## 2018-05-21 DIAGNOSIS — F411 Generalized anxiety disorder: Secondary | ICD-10-CM | POA: Diagnosis not present

## 2018-05-21 DIAGNOSIS — F3341 Major depressive disorder, recurrent, in partial remission: Secondary | ICD-10-CM | POA: Diagnosis not present

## 2018-07-31 IMAGING — MG DIGITAL SCREENING BILATERAL MAMMOGRAM WITH TOMO AND CAD
8 series · 8 of 24 positions shown · non-contrast
Comparison: Previous exam(s).

CLINICAL DATA: Screening.

EXAM:
DIGITAL SCREENING BILATERAL MAMMOGRAM WITH TOMO AND CAD

[L CC synth-2D]
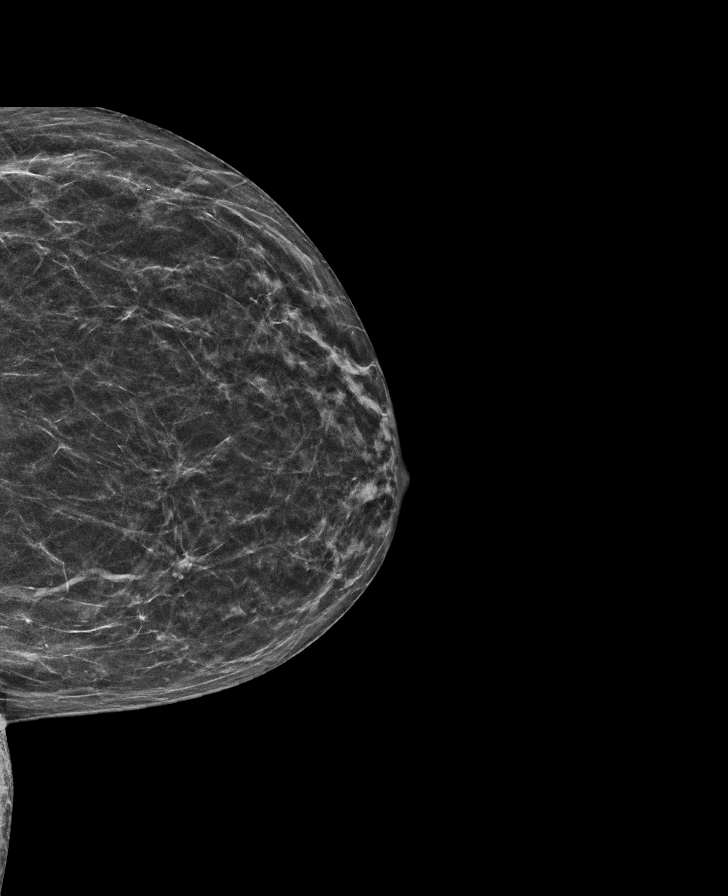

[R MLO synth-2D]
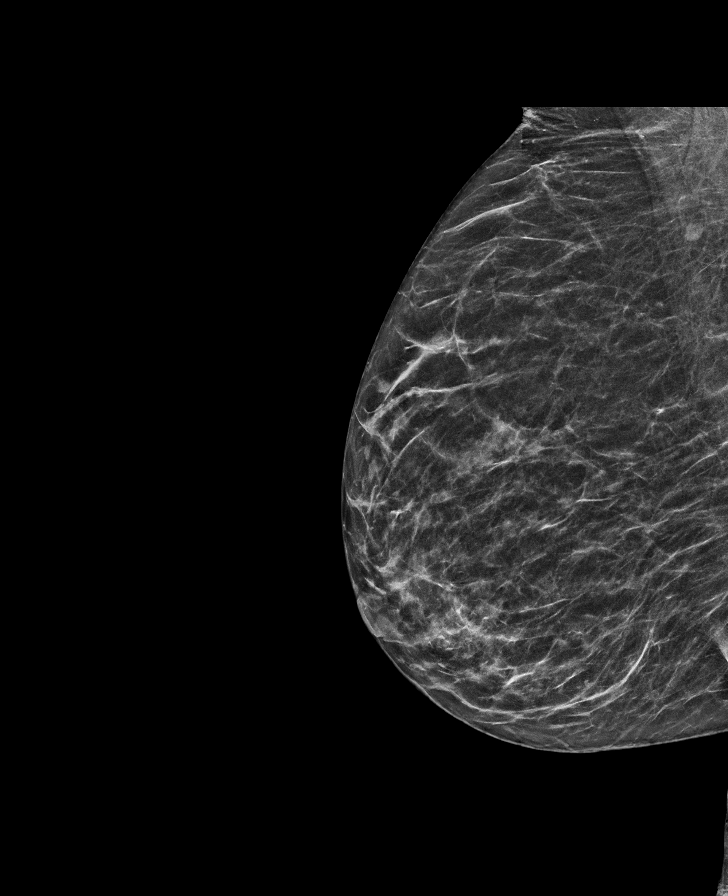

[R CC synth-2D]
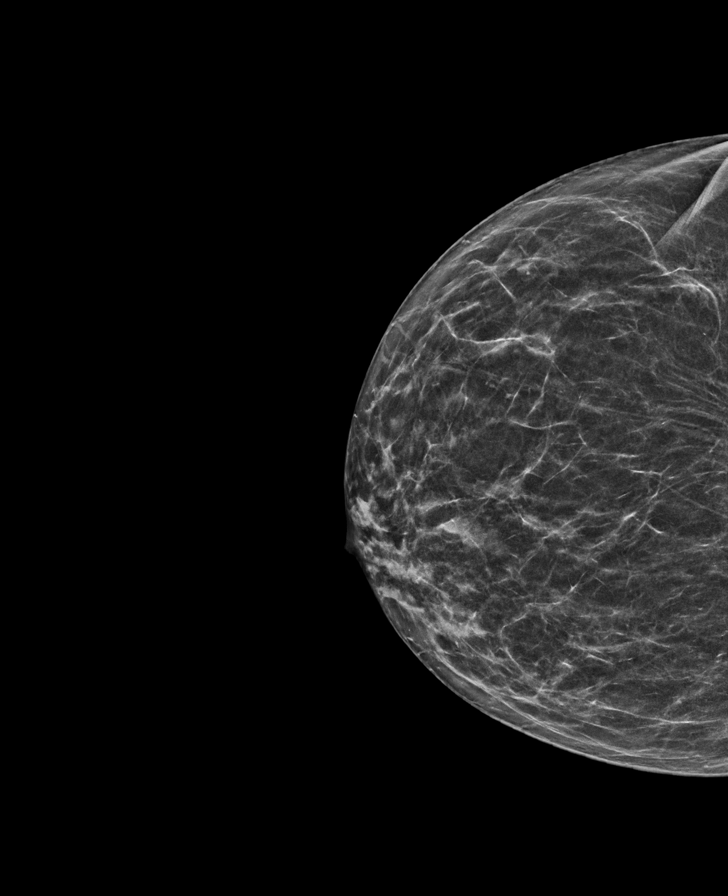

[L MLO synth-2D]
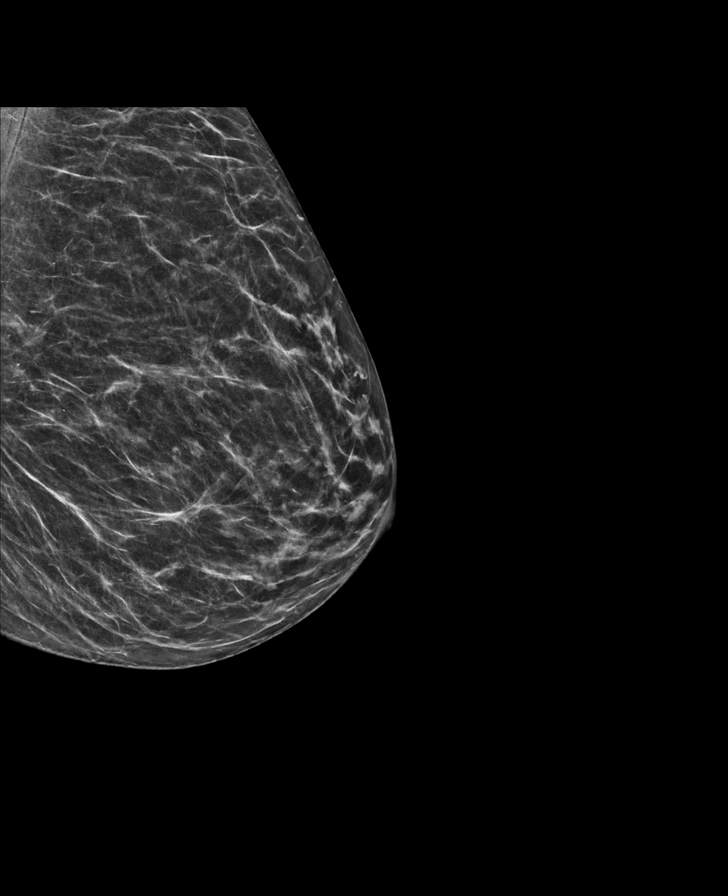

[R MLO tomo · tomo slice 28/55.0]
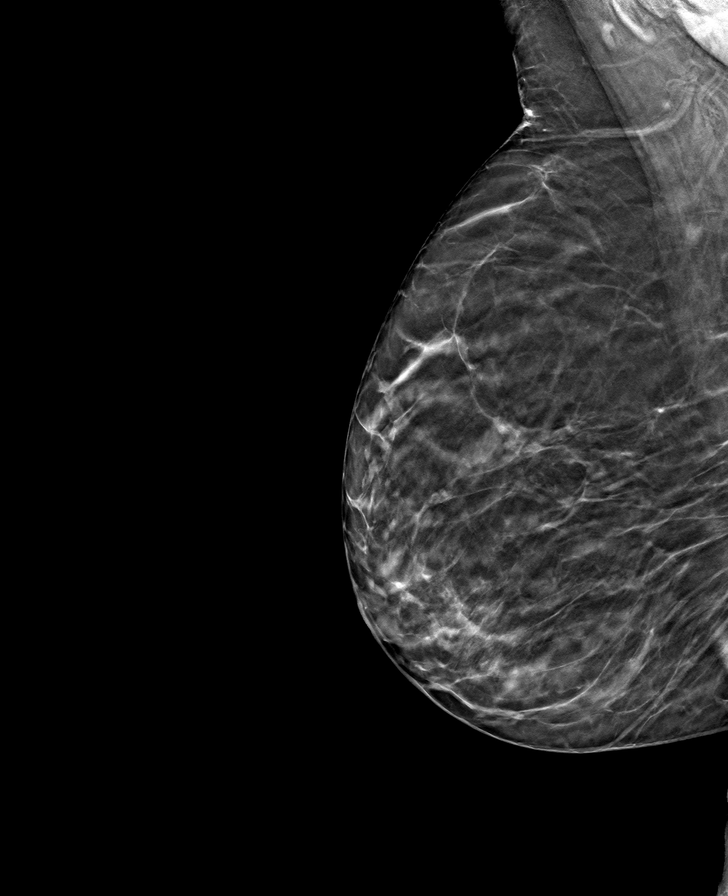

[L CC tomo · tomo slice 27/54.0]
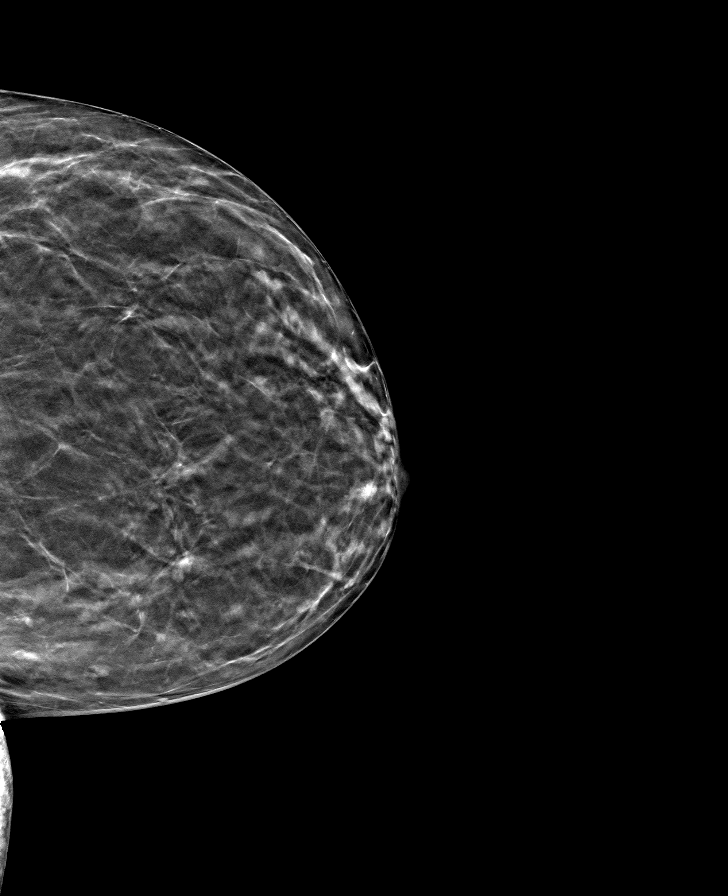

[R CC tomo · tomo slice 29/56.0]
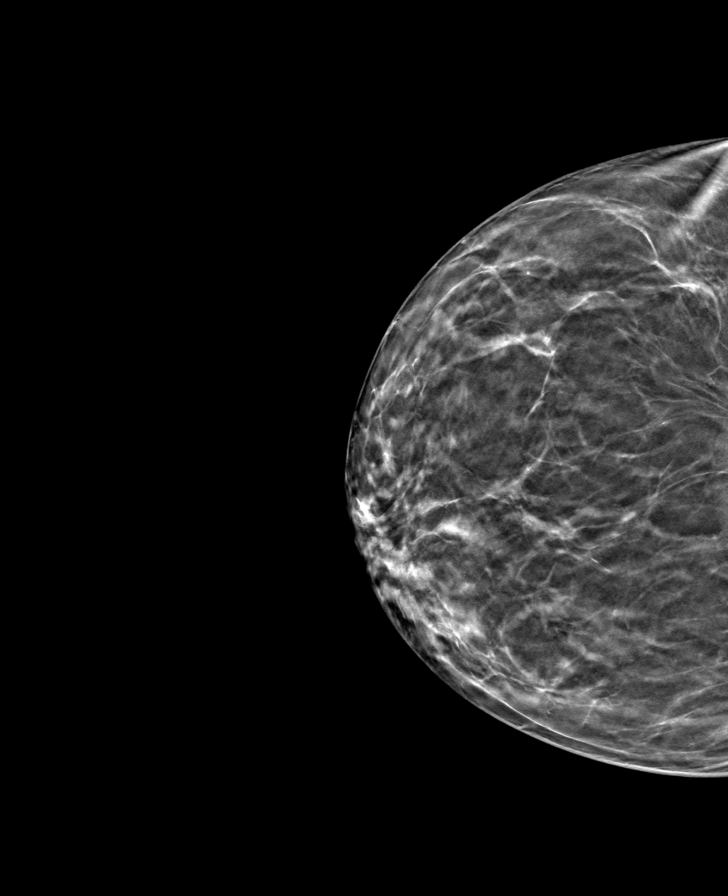

[L MLO tomo · tomo slice 28/55.0]
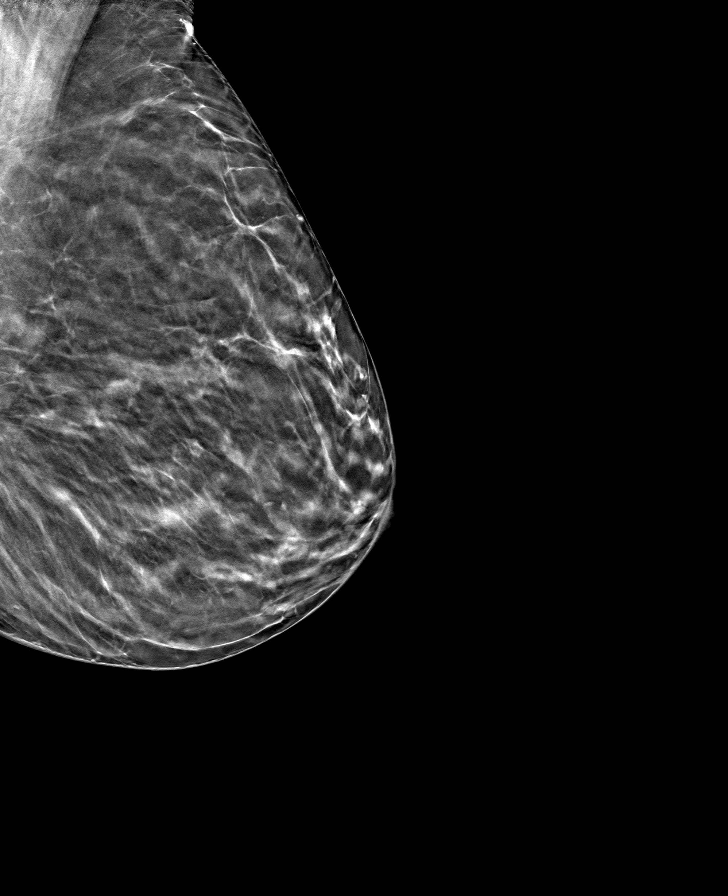

[8 of 24 positions shown; findings below may reference images not displayed]

ACR Breast Density Category b: There are scattered areas of
fibroglandular density.
FINDINGS: In the left breast, possible distortion warrants further evaluation.
In the right breast, no findings suspicious for malignancy. Images
were processed with CAD.
IMPRESSION: Further evaluation is suggested for possible distortion in the left
breast.

RECOMMENDATION:
Diagnostic mammogram and possibly ultrasound of the left breast.
(Code:[id number]-[id number])

The patient will be contacted regarding the findings, and additional
imaging will be scheduled.

BI-RADS CATEGORY  0: Incomplete. Need additional imaging evaluation
and/or prior mammograms for comparison.

## 2018-09-30 ENCOUNTER — Ambulatory Visit
Admission: RE | Admit: 2018-09-30 | Discharge: 2018-09-30 | Disposition: A | Discharge status: Home / Self Care | Payer: Medicare Other | Source: Ambulatory Visit | Attending: Family Medicine | Admitting: Family Medicine

## 2018-09-30 DIAGNOSIS — R928 Other abnormal and inconclusive findings on diagnostic imaging of breast: Secondary | ICD-10-CM | POA: Diagnosis not present

## 2018-09-30 DIAGNOSIS — N6489 Other specified disorders of breast: Secondary | ICD-10-CM

## 2018-11-19 DIAGNOSIS — F3341 Major depressive disorder, recurrent, in partial remission: Secondary | ICD-10-CM | POA: Diagnosis not present

## 2018-11-19 DIAGNOSIS — F411 Generalized anxiety disorder: Secondary | ICD-10-CM | POA: Diagnosis not present

## 2018-11-22 DIAGNOSIS — Z0000 Encounter for general adult medical examination without abnormal findings: Secondary | ICD-10-CM | POA: Diagnosis not present

## 2018-11-22 DIAGNOSIS — I10 Essential (primary) hypertension: Secondary | ICD-10-CM | POA: Diagnosis not present

## 2018-11-22 DIAGNOSIS — F325 Major depressive disorder, single episode, in full remission: Secondary | ICD-10-CM | POA: Diagnosis not present

## 2018-11-22 DIAGNOSIS — M810 Age-related osteoporosis without current pathological fracture: Secondary | ICD-10-CM | POA: Diagnosis not present

## 2018-12-09 DIAGNOSIS — Z23 Encounter for immunization: Secondary | ICD-10-CM | POA: Diagnosis not present

## 2018-12-23 DIAGNOSIS — I10 Essential (primary) hypertension: Secondary | ICD-10-CM | POA: Diagnosis not present

## 2018-12-23 DIAGNOSIS — M79604 Pain in right leg: Secondary | ICD-10-CM | POA: Diagnosis not present

## 2018-12-23 DIAGNOSIS — E785 Hyperlipidemia, unspecified: Secondary | ICD-10-CM | POA: Diagnosis not present

## 2018-12-23 DIAGNOSIS — M79605 Pain in left leg: Secondary | ICD-10-CM | POA: Diagnosis not present

## 2018-12-25 DIAGNOSIS — M79605 Pain in left leg: Secondary | ICD-10-CM | POA: Diagnosis not present

## 2018-12-25 DIAGNOSIS — I10 Essential (primary) hypertension: Secondary | ICD-10-CM | POA: Diagnosis not present

## 2018-12-25 DIAGNOSIS — E785 Hyperlipidemia, unspecified: Secondary | ICD-10-CM | POA: Diagnosis not present

## 2018-12-25 DIAGNOSIS — M859 Disorder of bone density and structure, unspecified: Secondary | ICD-10-CM | POA: Diagnosis not present

## 2018-12-25 DIAGNOSIS — M79604 Pain in right leg: Secondary | ICD-10-CM | POA: Diagnosis not present

## 2019-01-10 DIAGNOSIS — H5203 Hypermetropia, bilateral: Secondary | ICD-10-CM | POA: Diagnosis not present

## 2019-01-10 DIAGNOSIS — H2513 Age-related nuclear cataract, bilateral: Secondary | ICD-10-CM | POA: Diagnosis not present

## 2019-01-13 DIAGNOSIS — M79652 Pain in left thigh: Secondary | ICD-10-CM | POA: Diagnosis not present

## 2019-01-13 DIAGNOSIS — M79651 Pain in right thigh: Secondary | ICD-10-CM | POA: Diagnosis not present

## 2019-01-16 DIAGNOSIS — D2272 Melanocytic nevi of left lower limb, including hip: Secondary | ICD-10-CM | POA: Diagnosis not present

## 2019-01-16 DIAGNOSIS — Z85828 Personal history of other malignant neoplasm of skin: Secondary | ICD-10-CM | POA: Diagnosis not present

## 2019-01-16 DIAGNOSIS — L821 Other seborrheic keratosis: Secondary | ICD-10-CM | POA: Diagnosis not present

## 2019-01-16 DIAGNOSIS — D0439 Carcinoma in situ of skin of other parts of face: Secondary | ICD-10-CM | POA: Diagnosis not present

## 2019-01-16 DIAGNOSIS — D2271 Melanocytic nevi of right lower limb, including hip: Secondary | ICD-10-CM | POA: Diagnosis not present

## 2019-01-16 DIAGNOSIS — D1801 Hemangioma of skin and subcutaneous tissue: Secondary | ICD-10-CM | POA: Diagnosis not present

## 2019-01-16 DIAGNOSIS — L814 Other melanin hyperpigmentation: Secondary | ICD-10-CM | POA: Diagnosis not present

## 2019-01-24 DIAGNOSIS — Z85828 Personal history of other malignant neoplasm of skin: Secondary | ICD-10-CM | POA: Diagnosis not present

## 2019-01-24 DIAGNOSIS — D0439 Carcinoma in situ of skin of other parts of face: Secondary | ICD-10-CM | POA: Diagnosis not present

## 2019-01-29 DIAGNOSIS — M79651 Pain in right thigh: Secondary | ICD-10-CM | POA: Diagnosis not present

## 2019-01-29 DIAGNOSIS — M79652 Pain in left thigh: Secondary | ICD-10-CM | POA: Diagnosis not present

## 2019-02-03 DIAGNOSIS — M79651 Pain in right thigh: Secondary | ICD-10-CM | POA: Diagnosis not present

## 2019-02-03 DIAGNOSIS — M79652 Pain in left thigh: Secondary | ICD-10-CM | POA: Diagnosis not present

## 2019-02-17 DIAGNOSIS — M79652 Pain in left thigh: Secondary | ICD-10-CM | POA: Diagnosis not present

## 2019-02-17 DIAGNOSIS — M7061 Trochanteric bursitis, right hip: Secondary | ICD-10-CM | POA: Diagnosis not present

## 2019-02-17 DIAGNOSIS — M79651 Pain in right thigh: Secondary | ICD-10-CM | POA: Diagnosis not present

## 2019-03-17 ENCOUNTER — Ambulatory Visit: Payer: Medicare Other | Attending: Internal Medicine

## 2019-03-17 DIAGNOSIS — Z20822 Contact with and (suspected) exposure to covid-19: Secondary | ICD-10-CM

## 2019-03-18 LAB — NOVEL CORONAVIRUS, NAA: SARS-CoV-2, NAA: NOT DETECTED

## 2019-04-01 DIAGNOSIS — M79605 Pain in left leg: Secondary | ICD-10-CM | POA: Diagnosis not present

## 2019-04-01 DIAGNOSIS — M791 Myalgia, unspecified site: Secondary | ICD-10-CM | POA: Diagnosis not present

## 2019-04-01 DIAGNOSIS — Z6822 Body mass index (BMI) 22.0-22.9, adult: Secondary | ICD-10-CM | POA: Diagnosis not present

## 2019-04-01 DIAGNOSIS — R768 Other specified abnormal immunological findings in serum: Secondary | ICD-10-CM | POA: Diagnosis not present

## 2019-04-01 DIAGNOSIS — M79604 Pain in right leg: Secondary | ICD-10-CM | POA: Diagnosis not present

## 2019-04-15 ENCOUNTER — Ambulatory Visit: Payer: Medicare Other | Attending: Internal Medicine

## 2019-04-15 DIAGNOSIS — Z23 Encounter for immunization: Secondary | ICD-10-CM | POA: Diagnosis not present

## 2019-04-15 NOTE — Progress Notes (Signed)
   Covid-19 Vaccination Clinic  Name:  Karen Hancock    MRN: [id number] DOB: 23-May-1950  04/15/2019  [name] was observed post Covid-19 immunization for 15 minutes without incidence. She was provided with Vaccine Information Sheet and instruction to access the V-Safe system.   [name] was instructed to call 911 with any severe reactions post vaccine: Marland Kitchen Difficulty breathing  . Swelling of your face and throat  . A fast heartbeat  . A bad rash all over your body  . Dizziness and weakness    Immunizations Administered    Name Date Dose VIS Date Route   Pfizer COVID-19 Vaccine 04/15/2019 12:12 PM 0.3 mL 03/07/2019 Intramuscular   Manufacturer: [place]   Lot: [id number]   [place]: [id number]

## 2019-04-28 ENCOUNTER — Ambulatory Visit: Payer: Medicare Other

## 2019-04-30 DIAGNOSIS — M5136 Other intervertebral disc degeneration, lumbar region: Secondary | ICD-10-CM | POA: Diagnosis not present

## 2019-05-05 ENCOUNTER — Ambulatory Visit: Payer: Medicare Other | Attending: Internal Medicine

## 2019-05-05 DIAGNOSIS — Z23 Encounter for immunization: Secondary | ICD-10-CM

## 2019-05-05 NOTE — Progress Notes (Signed)
   Covid-19 Vaccination Clinic  Name:  Karen Hancock    MRN: [id number] DOB: Sep 14, 1950  05/05/2019  [name] was observed post Covid-19 immunization for 15 minutes without incidence. She was provided with Vaccine Information Sheet and instruction to access the V-Safe system.   [name] was instructed to call 911 with any severe reactions post vaccine: Marland Kitchen Difficulty breathing  . Swelling of your face and throat  . A fast heartbeat  . A bad rash all over your body  . Dizziness and weakness    Immunizations Administered    Name Date Dose VIS Date Route   Pfizer COVID-19 Vaccine 05/05/2019 11:53 AM 0.3 mL 03/07/2019 Intramuscular   Manufacturer: [place]   Lot: [id number]   [place]: [id number]

## 2019-05-12 DIAGNOSIS — M79651 Pain in right thigh: Secondary | ICD-10-CM | POA: Diagnosis not present

## 2019-05-19 DIAGNOSIS — M79651 Pain in right thigh: Secondary | ICD-10-CM | POA: Diagnosis not present

## 2019-05-19 DIAGNOSIS — M79652 Pain in left thigh: Secondary | ICD-10-CM | POA: Diagnosis not present

## 2019-05-21 DIAGNOSIS — F3341 Major depressive disorder, recurrent, in partial remission: Secondary | ICD-10-CM | POA: Diagnosis not present

## 2019-05-21 DIAGNOSIS — M79651 Pain in right thigh: Secondary | ICD-10-CM | POA: Diagnosis not present

## 2019-05-21 DIAGNOSIS — F411 Generalized anxiety disorder: Secondary | ICD-10-CM | POA: Diagnosis not present

## 2019-05-26 DIAGNOSIS — M79652 Pain in left thigh: Secondary | ICD-10-CM | POA: Diagnosis not present

## 2019-05-26 DIAGNOSIS — M79651 Pain in right thigh: Secondary | ICD-10-CM | POA: Diagnosis not present

## 2019-05-28 DIAGNOSIS — M79651 Pain in right thigh: Secondary | ICD-10-CM | POA: Diagnosis not present

## 2019-06-02 DIAGNOSIS — M79652 Pain in left thigh: Secondary | ICD-10-CM | POA: Diagnosis not present

## 2019-06-02 DIAGNOSIS — M79651 Pain in right thigh: Secondary | ICD-10-CM | POA: Diagnosis not present

## 2019-06-04 DIAGNOSIS — M79652 Pain in left thigh: Secondary | ICD-10-CM | POA: Diagnosis not present

## 2019-06-04 DIAGNOSIS — M79651 Pain in right thigh: Secondary | ICD-10-CM | POA: Diagnosis not present

## 2019-06-05 DIAGNOSIS — M5416 Radiculopathy, lumbar region: Secondary | ICD-10-CM | POA: Diagnosis not present

## 2019-06-09 DIAGNOSIS — M79651 Pain in right thigh: Secondary | ICD-10-CM | POA: Diagnosis not present

## 2019-06-12 DIAGNOSIS — M48062 Spinal stenosis, lumbar region with neurogenic claudication: Secondary | ICD-10-CM | POA: Diagnosis not present

## 2019-06-17 DIAGNOSIS — M48062 Spinal stenosis, lumbar region with neurogenic claudication: Secondary | ICD-10-CM | POA: Diagnosis not present

## 2019-06-18 ENCOUNTER — Other Ambulatory Visit: Payer: Self-pay | Admitting: Neurosurgery

## 2019-07-07 ENCOUNTER — Other Ambulatory Visit: Payer: Medicare Other

## 2019-07-08 NOTE — Progress Notes (Signed)
[id number] [place], [place] [address] [place] [address] Phone: [phone] Fax: [phone]  [place], [place] - [address] [place] [address] Phone: [phone] Fax: [phone]  [facility] [address], [place] - [place] [address] Phone: [phone] Fax: [phone]      Your procedure is scheduled on Wednesday, April 21st.  Report to [facility] Main Entrance "A" at 10:00 A.M., and check in at the Admitting office.  Call this number if you have problems the morning of surgery:  [phone]  Call [phone] if you have any questions prior to your surgery date Monday-Friday 8am-4pm    Remember:  Do not eat or drink after midnight the night before your surgery     Take these medicines the morning of surgery with A SIP OF WATER   Abilify  Atorvastatin (Lipitor)  Cymbalta     As of today, STOP taking any Aspirin (unless otherwise instructed by your surgeon) and Aspirin containing products, Aleve, Naproxen, Ibuprofen, Motrin, Advil, Goody's, BC's, all herbal medications, Santillo oil, and all vitamins.                      Do not wear jewelry, make up, or nail polish            Do not wear lotions, powders, perfumes, or deodorant.            Do not shave 48 hours prior to surgery.             Do not bring valuables to the hospital.            [facility] is not responsible for any belongings or valuables.  Do NOT Smoke (Tobacco/Vapping) or drink Alcohol 24 hours prior to your procedure If you use a CPAP at night, you may bring all equipment for your overnight stay.   Contacts, glasses, dentures or bridgework may not be worn into surgery.      For patients admitted to the hospital, discharge time will be determined by your treatment team.   Patients discharged the day of surgery will not be allowed to drive home, and  someone needs to stay with them for 24 hours.    Special instructions:   - Preparing For Surgery  Before surgery, you can play an important role. Because skin is not sterile, your skin needs to be as free of germs as possible. You can reduce the number of germs on your skin by washing with CHG (chlorahexidine gluconate) Soap before surgery.  CHG is an antiseptic cleaner which kills germs and bonds with the skin to continue killing germs even after washing.    Oral Hygiene is also important to reduce your risk of infection.  Remember - BRUSH YOUR TEETH THE MORNING OF SURGERY WITH YOUR REGULAR TOOTHPASTE  Please do not use if you have an allergy to CHG or antibacterial soaps. If your skin becomes reddened/irritated stop using the CHG.  Do not shave (including legs and underarms) for at least 48 hours prior to first CHG shower. It is OK to shave your face.  Please follow these instructions carefully.   1. Shower the NIGHT BEFORE SURGERY and the MORNING OF SURGERY with CHG Soap.   2. If you chose to wash your hair, wash your hair first as usual with your normal shampoo.  3. After you shampoo, rinse your hair and body thoroughly to remove the shampoo.  4. Use CHG as you would any other liquid soap. You can apply CHG directly to the skin and wash gently with a scrungie or a clean washcloth.   5. Apply the CHG Soap to your body ONLY FROM THE NECK DOWN.  Do not use on open wounds or open sores. Avoid contact with your eyes, ears, mouth and genitals (private parts). Wash Face and genitals (private parts)  with your normal soap.   6. Wash thoroughly, paying special attention to the area where your surgery will be performed.  7. Thoroughly rinse your body with warm water from the neck down.  8. DO NOT shower/wash with your normal soap after using and rinsing off the CHG Soap.  9. Pat yourself dry with a CLEAN TOWEL.  10. Wear CLEAN PAJAMAS to bed the night before surgery, wear  comfortable clothes the morning of surgery  11. Place CLEAN SHEETS on your bed the night of your first shower and DO NOT SLEEP WITH PETS.   Day of Surgery:   Do not apply any deodorants/lotions.  Please wear clean clothes to the hospital/surgery center.   Remember to brush your teeth WITH YOUR REGULAR TOOTHPASTE.   Please read over the following fact sheets that you were given.

## 2019-07-09 ENCOUNTER — Encounter (HOSPITAL_COMMUNITY)
Admission: RE | Admit: 2019-07-09 | Discharge: 2019-07-09 | Disposition: A | Discharge status: Home / Self Care | Payer: Medicare Other | Source: Ambulatory Visit | Attending: Neurosurgery | Admitting: Neurosurgery

## 2019-07-09 ENCOUNTER — Other Ambulatory Visit: Payer: Self-pay

## 2019-07-09 ENCOUNTER — Encounter (HOSPITAL_COMMUNITY): Payer: Self-pay

## 2019-07-09 ENCOUNTER — Ambulatory Visit: Payer: Medicare Other | Attending: Internal Medicine

## 2019-07-09 DIAGNOSIS — R6883 Chills (without fever): Secondary | ICD-10-CM | POA: Diagnosis not present

## 2019-07-09 DIAGNOSIS — Z20822 Contact with and (suspected) exposure to covid-19: Secondary | ICD-10-CM

## 2019-07-09 DIAGNOSIS — Z01818 Encounter for other preprocedural examination: Secondary | ICD-10-CM | POA: Diagnosis not present

## 2019-07-09 DIAGNOSIS — R000 Tachycardia, unspecified: Secondary | ICD-10-CM | POA: Diagnosis not present

## 2019-07-09 DIAGNOSIS — R52 Pain, unspecified: Secondary | ICD-10-CM | POA: Diagnosis not present

## 2019-07-09 DIAGNOSIS — Z87440 Personal history of urinary (tract) infections: Secondary | ICD-10-CM | POA: Diagnosis not present

## 2019-07-09 DIAGNOSIS — R0981 Nasal congestion: Secondary | ICD-10-CM | POA: Diagnosis not present

## 2019-07-09 DIAGNOSIS — R509 Fever, unspecified: Secondary | ICD-10-CM | POA: Diagnosis not present

## 2019-07-09 DIAGNOSIS — J029 Acute pharyngitis, unspecified: Secondary | ICD-10-CM | POA: Diagnosis not present

## 2019-07-09 HISTORY — DX: Family history of other specified conditions: Z84.89

## 2019-07-09 LAB — CBC
HCT: 42.5 % (ref 36.0–46.0)
Hemoglobin: 13.6 g/dL (ref 12.0–15.0)
MCH: 31.2 pg (ref 26.0–34.0)
MCHC: 32 g/dL (ref 30.0–36.0)
MCV: 97.5 fL (ref 80.0–100.0)
Platelets: 245 10*3/uL (ref 150–400)
RBC: 4.36 MIL/uL (ref 3.87–5.11)
RDW: 12.2 % (ref 11.5–15.5)
WBC: 16 10*3/uL — ABNORMAL HIGH (ref 4.0–10.5)
nRBC: 0 % (ref 0.0–0.2)

## 2019-07-09 LAB — BASIC METABOLIC PANEL
Anion gap: 9 (ref 5–15)
BUN: 13 mg/dL (ref 8–23)
CO2: 24 mmol/L (ref 22–32)
Calcium: 8.7 mg/dL — ABNORMAL LOW (ref 8.9–10.3)
Chloride: 104 mmol/L (ref 98–111)
Creatinine, Ser: 0.62 mg/dL (ref 0.44–1.00)
GFR calc Af Amer: >60 mL/min (ref >60)
GFR calc non Af Amer: >60 mL/min (ref >60)
Glucose, Bld: 105 mg/dL — ABNORMAL HIGH (ref 70–99)
Potassium: 4 mmol/L (ref 3.5–5.1)
Sodium: 137 mmol/L (ref 135–145)

## 2019-07-09 LAB — SURGICAL PCR SCREEN
MRSA, PCR: NEGATIVE
Staphylococcus aureus: NEGATIVE

## 2019-07-09 NOTE — Progress Notes (Addendum)
PCP - Kathyrn Lass, MD Cardiologist - pt denies  Chest x-ray - n/a EKG - 07/09/19 >>pt EKG reading - sinus tachy 111, PAT vitals HR 117. Jeneen Rinks, [place] notified and came to see pt. PT was instructed to call PCP to notify and see if there was anything that needed to be done prior to surgery.  Pt was otherwise asymptomatic and per pt, high HR was her normal. No SOB or chest pain.  Stress Test - pt denies ECHO - 11/09/2006 Cardiac Cath - pt denies   COVID TEST- 07/14/19   Coronavirus Screening  Have you experienced the following symptoms:  Cough yes/no: No Fever (>100.19F)  yes/no: No Runny nose yes/no: No Sore throat yes/no: No Difficulty breathing/shortness of breath  yes/no: No  Have you or a family member traveled in the last 14 days and where? yes/no: No   If the patient indicates "YES" to the above questions, their PAT will be rescheduled to limit the exposure to others and, the surgeon will be notified. THE PATIENT WILL NEED TO BE ASYMPTOMATIC FOR 14 DAYS.   If the patient is not experiencing any of these symptoms, the PAT nurse will instruct them to NOT bring anyone with them to their appointment since they may have these symptoms or traveled as well.   Please remind your patients and families that hospital visitation restrictions are in effect and the importance of the restrictions.     Anesthesia review: yes, HR elevated 111-117 in PAT, documents for last office visits requested, WBC >15  During PAT, pt revealed that she had a fever on Monday (pt couldn't remember the exact temp) and she called the doctors office and was told to still come to her PAT appt if she felt better and if her fever resolved. Temp today 99.19F and no complains of any body aches, chills, SOB, headaches, nausea/vomiting. Pt said while she felt sick, she just had slight body aches. Today, she presents with an elevated HR 117 on dinamap and 111 on EKG, otherwise, pt feels fine. Jeneen Rinks, [place] notified and saw pt in  PAT.   Patient denies shortness of breath, fever, cough and chest pain at PAT appointment   All instructions explained to the patient, with a verbal understanding of the material. Patient agrees to go over the instructions while at home for a better understanding. Patient also instructed to self quarantine after being tested for COVID-19. The opportunity to ask questions was provided.

## 2019-07-09 NOTE — Progress Notes (Signed)
Karen Hancock    Your procedure is scheduled on Wednesday, April 21st.  Report to [facility] Main Entrance "A" at 10:00 A.M., and check in at the Admitting office.  Call this number if you have problems the morning of surgery:  [phone]  Call [phone] if you have any questions prior to your surgery date Monday-Friday 8am-4pm   Remember:  Do not eat or drink after midnight the night before your surgery    Take these medicines the morning of surgery with A SIP OF WATER   Abilify  Atorvastatin (Lipitor)  Cymbalta     As of today, STOP taking any Aspirin (unless otherwise instructed by your surgeon) and Aspirin containing products, Aleve, Naproxen, Ibuprofen, Motrin, Advil, Goody's, BC's, all herbal medications, Witty oil, and all vitamins.                      Do not wear jewelry, make up, or nail polish            Do not wear lotions, powders, perfumes, or deodorant.            Do not shave 48 hours prior to surgery.             Do not bring valuables to the hospital.            [facility] is not responsible for any belongings or valuables.  Do NOT Smoke (Tobacco/Vapping) or drink Alcohol 24 hours prior to your procedure If you use a CPAP at night, you may bring all equipment for your overnight stay.   Contacts, glasses, dentures or bridgework may not be worn into surgery.      For patients admitted to the hospital, discharge time will be determined by your treatment team.   Patients discharged the day of surgery will not be allowed to drive home, and someone needs to stay with them for 24 hours.    Special instructions:   [place]- Preparing For Surgery  Before surgery, you can play an important role. Because skin is not sterile, your skin needs to be as free of germs as possible. You can reduce the number of germs on your skin by washing with CHG (chlorahexidine gluconate) Soap before surgery.  CHG is an antiseptic cleaner which kills germs and bonds with the skin  to continue killing germs even after washing.    Oral Hygiene is also important to reduce your risk of infection.  Remember - BRUSH YOUR TEETH THE MORNING OF SURGERY WITH YOUR REGULAR TOOTHPASTE  Please do not use if you have an allergy to CHG or antibacterial soaps. If your skin becomes reddened/irritated stop using the CHG.  Do not shave (including legs and underarms) for at least 48 hours prior to first CHG shower. It is OK to shave your face.  Please follow these instructions carefully.   1. Shower the NIGHT BEFORE SURGERY and the MORNING OF SURGERY with CHG Soap.   2. If you chose to wash your hair, wash your hair first as usual with your normal shampoo.  3. After you shampoo, rinse your hair and body thoroughly to remove the shampoo.  4. Use CHG as you would any other liquid soap. You can apply CHG directly to the skin and wash gently with a scrungie or a clean washcloth.   5. Apply the CHG Soap to your body ONLY FROM THE NECK DOWN.  Do not use on open wounds or open sores. Avoid contact with  your eyes, ears, mouth and genitals (private parts). Wash Face and genitals (private parts)  with your normal soap.   6. Wash thoroughly, paying special attention to the area where your surgery will be performed.  7. Thoroughly rinse your body with warm water from the neck down.  8. DO NOT shower/wash with your normal soap after using and rinsing off the CHG Soap.  9. Pat yourself dry with a CLEAN TOWEL.  10. Wear CLEAN PAJAMAS to bed the night before surgery, wear comfortable clothes the morning of surgery  11. Place CLEAN SHEETS on your bed the night of your first shower and DO NOT SLEEP WITH PETS.   Day of Surgery:   Do not apply any deodorants/lotions.  Please wear clean clothes to the hospital/surgery center.   Remember to brush your teeth WITH YOUR REGULAR TOOTHPASTE.   Please read over the following fact sheets that you were given.

## 2019-07-10 LAB — SARS-COV-2, NAA 2 DAY TAT

## 2019-07-10 LAB — NOVEL CORONAVIRUS, NAA: SARS-CoV-2, NAA: NOT DETECTED

## 2019-07-10 NOTE — Progress Notes (Signed)
Anesthesia Chart Review:  Pt mildly tachycardic at PAT, 117 on arrival and 111 on EKG with sinus rhythm. She says she thinks her HR tends to "run a little high" but is unsure how high. She denies any CV history. She does report feeling "feverish" on Monday 4/12 but no other symptoms. Feels well today, although somewhat anxious about surgery. Denies cough, SOB, CP or any other s/sx of illness. I advised her to call PCP Dr. Kathyrn Lass to followup on elevated HR given upcoming surgery. I also requested and reviewed last 3 OV notes from PCP notes - 2 were telemedicine with no vitals and the last in-office note from 2019 recorded HR of 85. Preop labs also show mildly elevated WBC at 16. I faxed EKG and labs to Dr. Ammie Ferrier office.   I called pt on 4/15 to followup. She said she had seen a PA in Dr. Ammie Ferrier office today and her HR was in the 90s. She said she also has been checking her Apple watch over the past day and her baseline is mid 90s. Overall she is feeling well. She said the provider she saw did order a UA to check for UTI and culture results are pending. Will followup on results.   Update 07/15/19 @ 15:53. I called pt and she reported that her PCP's office had called her earlier today to let her know that urine culture was positive for UTI and she has been started on Macrobid. She says she is feeling well and currently asymptomatic. She has called and spoken with Dr. Windy Carina office and advised to proceed as planned.   COVID test 07/14/19 negative.  Anticipate she can proceed as planned barring acute status change.   EKG 07/09/19: Sinus tachycardia. Rate 111. Nonspecific T wave abnormality.   Wynonia Musty [facility] Short Stay Center/Anesthesiology Phone [phone] 07/15/2019 3:54 PM

## 2019-07-14 ENCOUNTER — Other Ambulatory Visit (HOSPITAL_COMMUNITY)
Admission: RE | Admit: 2019-07-14 | Discharge: 2019-07-14 | Disposition: A | Discharge status: Home / Self Care | Payer: Medicare Other | Source: Ambulatory Visit | Attending: Neurosurgery | Admitting: Neurosurgery

## 2019-07-14 DIAGNOSIS — Z01812 Encounter for preprocedural laboratory examination: Secondary | ICD-10-CM | POA: Diagnosis not present

## 2019-07-14 DIAGNOSIS — Z20822 Contact with and (suspected) exposure to covid-19: Secondary | ICD-10-CM | POA: Diagnosis not present

## 2019-07-14 LAB — SARS CORONAVIRUS 2 (TAT 6-24 HRS): SARS Coronavirus 2: NEGATIVE

## 2019-07-15 NOTE — Anesthesia Preprocedure Evaluation (Addendum)
Anesthesia Evaluation  Patient identified by MRN, date of birth, ID band Patient awake    Reviewed: Allergy & Precautions, NPO status , Patient's Chart, lab work & pertinent test results  Airway Mallampati: II  TM Distance: >3 FB Neck ROM: Full    Dental  (+) Dental Advisory Given   Pulmonary former smoker,    breath sounds clear to auscultation       Cardiovascular hypertension, Pt. on medications  Rhythm:Regular Rate:Normal     Neuro/Psych negative neurological ROS     GI/Hepatic negative GI ROS, Neg liver ROS,   Endo/Other  negative endocrine ROS  Renal/GU negative Renal ROS     Musculoskeletal   Abdominal   Peds  Hematology negative hematology ROS (+)   Anesthesia Other Findings   Reproductive/Obstetrics                            Lab Results  Component Value Date   WBC 16.0 (H) 07/09/2019   HGB 13.6 07/09/2019   HCT 42.5 07/09/2019   MCV 97.5 07/09/2019   PLT 245 07/09/2019   Lab Results  Component Value Date   CREATININE 0.62 07/09/2019   BUN 13 07/09/2019   NA 137 07/09/2019   K 4.0 07/09/2019   CL 104 07/09/2019   CO2 24 07/09/2019    Anesthesia Physical Anesthesia Plan  ASA: II  Anesthesia Plan: General   Post-op Pain Management:    Induction: Intravenous  PONV Risk Score and Plan: 3 and Dexamethasone, Ondansetron and Treatment may vary due to age or medical condition  Airway Management Planned: Oral ETT  Additional Equipment: None  Intra-op Plan:   Post-operative Plan: Extubation in OR  Informed Consent: I have reviewed the patients History and Physical, chart, labs and discussed the procedure including the risks, benefits and alternatives for the proposed anesthesia with the patient or authorized representative who has indicated his/her understanding and acceptance.     Dental advisory given  Plan Discussed with: CRNA  Anesthesia Plan Comments:  ( )       Anesthesia Quick Evaluation

## 2019-07-16 ENCOUNTER — Encounter (HOSPITAL_COMMUNITY): Payer: Self-pay | Admitting: Neurosurgery

## 2019-07-16 ENCOUNTER — Ambulatory Visit (HOSPITAL_COMMUNITY): Payer: Medicare Other | Admitting: Physician Assistant

## 2019-07-16 ENCOUNTER — Other Ambulatory Visit: Payer: Self-pay

## 2019-07-16 ENCOUNTER — Encounter (HOSPITAL_COMMUNITY)
Admission: RE | Disposition: A | Discharge status: Home / Self Care | Payer: Self-pay | Source: Home / Self Care | Attending: Neurosurgery

## 2019-07-16 ENCOUNTER — Ambulatory Visit (HOSPITAL_COMMUNITY)
Admission: RE | Admit: 2019-07-16 | Discharge: 2019-07-17 | Disposition: A | Discharge status: Home / Self Care | Payer: Medicare Other | Attending: Neurosurgery | Admitting: Neurosurgery

## 2019-07-16 ENCOUNTER — Ambulatory Visit (HOSPITAL_COMMUNITY): Payer: Medicare Other | Admitting: Anesthesiology

## 2019-07-16 ENCOUNTER — Ambulatory Visit (HOSPITAL_COMMUNITY): Payer: Medicare Other

## 2019-07-16 DIAGNOSIS — Z7983 Long term (current) use of bisphosphonates: Secondary | ICD-10-CM | POA: Insufficient documentation

## 2019-07-16 DIAGNOSIS — Z79899 Other long term (current) drug therapy: Secondary | ICD-10-CM | POA: Diagnosis not present

## 2019-07-16 DIAGNOSIS — M5416 Radiculopathy, lumbar region: Secondary | ICD-10-CM | POA: Insufficient documentation

## 2019-07-16 DIAGNOSIS — Z87891 Personal history of nicotine dependence: Secondary | ICD-10-CM | POA: Insufficient documentation

## 2019-07-16 DIAGNOSIS — M48061 Spinal stenosis, lumbar region without neurogenic claudication: Secondary | ICD-10-CM | POA: Insufficient documentation

## 2019-07-16 DIAGNOSIS — Z419 Encounter for procedure for purposes other than remedying health state, unspecified: Secondary | ICD-10-CM

## 2019-07-16 DIAGNOSIS — Z981 Arthrodesis status: Secondary | ICD-10-CM | POA: Diagnosis not present

## 2019-07-16 DIAGNOSIS — I10 Essential (primary) hypertension: Secondary | ICD-10-CM | POA: Diagnosis not present

## 2019-07-16 DIAGNOSIS — F419 Anxiety disorder, unspecified: Secondary | ICD-10-CM | POA: Insufficient documentation

## 2019-07-16 HISTORY — PX: LUMBAR LAMINECTOMY/DECOMPRESSION MICRODISCECTOMY: SHX5026

## 2019-07-16 SURGERY — LUMBAR LAMINECTOMY/DECOMPRESSION MICRODISCECTOMY 2 LEVELS
Anesthesia: General | Site: Back | Laterality: Bilateral

## 2019-07-16 MED ORDER — LIDOCAINE-EPINEPHRINE 1 %-1:100000 IJ SOLN
INTRAMUSCULAR | Status: AC
  Filled 2019-07-16: qty 1

## 2019-07-16 MED ORDER — PHENOL 1.4 % MT LIQD: 1.0000 | OROMUCOSAL | Status: DC | PRN

## 2019-07-16 MED ORDER — SUGAMMADEX SODIUM 200 MG/2ML IV SOLN
INTRAVENOUS | Status: DC | PRN
  Administered 2019-07-16: 200 mg via INTRAVENOUS

## 2019-07-16 MED ORDER — MIDAZOLAM HCL 2 MG/2ML IJ SOLN
INTRAMUSCULAR | Status: AC
  Filled 2019-07-16: qty 2

## 2019-07-16 MED ORDER — MIDAZOLAM HCL 2 MG/2ML IJ SOLN
INTRAMUSCULAR | Status: DC | PRN
  Administered 2019-07-16: 2 mg via INTRAVENOUS

## 2019-07-16 MED ORDER — VITAMIN D 25 MCG (1000 UNIT) PO TABS
5000.0000 [IU] | ORAL_TABLET | Freq: Every day | ORAL | Status: DC
  Administered 2019-07-16: 5000 [IU] via ORAL
  Filled 2019-07-16: qty 5

## 2019-07-16 MED ORDER — PHENYLEPHRINE 40 MCG/ML (10ML) SYRINGE FOR IV PUSH (FOR BLOOD PRESSURE SUPPORT)
PREFILLED_SYRINGE | INTRAVENOUS | Status: DC | PRN
  Administered 2019-07-16 (×5): 80 ug via INTRAVENOUS

## 2019-07-16 MED ORDER — OXYCODONE HCL 5 MG PO TABS
10.0000 mg | ORAL_TABLET | ORAL | Status: DC | PRN
  Administered 2019-07-17: 10 mg via ORAL
  Filled 2019-07-16: qty 2

## 2019-07-16 MED ORDER — CHLORHEXIDINE GLUCONATE CLOTH 2 % EX PADS: 6.0000 | MEDICATED_PAD | Freq: Once | CUTANEOUS | Status: DC

## 2019-07-16 MED ORDER — THROMBIN 5000 UNITS EX SOLR
CUTANEOUS | Status: AC
  Filled 2019-07-16: qty 15000

## 2019-07-16 MED ORDER — DULOXETINE HCL 30 MG PO CPEP: 90.0000 mg | ORAL_CAPSULE | Freq: Every morning | ORAL | Status: DC

## 2019-07-16 MED ORDER — FENTANYL CITRATE (PF) 100 MCG/2ML IJ SOLN: 25.0000 ug | INTRAMUSCULAR | Status: DC | PRN

## 2019-07-16 MED ORDER — ONDANSETRON HCL 4 MG/2ML IJ SOLN
INTRAMUSCULAR | Status: DC | PRN
  Administered 2019-07-16: 4 mg via INTRAVENOUS

## 2019-07-16 MED ORDER — CIPROFLOXACIN HCL 500 MG PO TABS: 500.0000 mg | ORAL_TABLET | Freq: Two times a day (BID) | ORAL | Status: DC

## 2019-07-16 MED ORDER — HYDROMORPHONE HCL 1 MG/ML IJ SOLN: 0.5000 mg | INTRAMUSCULAR | Status: DC | PRN

## 2019-07-16 MED ORDER — CEFAZOLIN SODIUM-DEXTROSE 2-4 GM/100ML-% IV SOLN
INTRAVENOUS | Status: AC
  Filled 2019-07-16: qty 100

## 2019-07-16 MED ORDER — ROCURONIUM BROMIDE 10 MG/ML (PF) SYRINGE
PREFILLED_SYRINGE | INTRAVENOUS | Status: DC | PRN
  Administered 2019-07-16: 40 mg via INTRAVENOUS
  Administered 2019-07-16 (×2): 15 mg via INTRAVENOUS

## 2019-07-16 MED ORDER — TRAZODONE HCL 100 MG PO TABS
100.0000 mg | ORAL_TABLET | Freq: Every day | ORAL | Status: DC
  Administered 2019-07-16: 100 mg via ORAL
  Filled 2019-07-16: qty 1

## 2019-07-16 MED ORDER — SODIUM CHLORIDE 0.9% FLUSH: 3.0000 mL | Freq: Two times a day (BID) | INTRAVENOUS | Status: DC

## 2019-07-16 MED ORDER — PHENAZOPYRIDINE HCL 100 MG PO TABS
100.0000 mg | ORAL_TABLET | Freq: Every day | ORAL | Status: DC
  Administered 2019-07-16: 100 mg via ORAL
  Filled 2019-07-16 (×2): qty 1

## 2019-07-16 MED ORDER — PANTOPRAZOLE SODIUM 40 MG PO TBEC
40.0000 mg | DELAYED_RELEASE_TABLET | Freq: Every day | ORAL | Status: DC
  Administered 2019-07-16: 40 mg via ORAL
  Filled 2019-07-16: qty 1

## 2019-07-16 MED ORDER — MENTHOL 3 MG MT LOZG: 1.0000 | LOZENGE | OROMUCOSAL | Status: DC | PRN

## 2019-07-16 MED ORDER — SODIUM CHLORIDE 0.9% FLUSH: 3.0000 mL | INTRAVENOUS | Status: DC | PRN

## 2019-07-16 MED ORDER — ACETAMINOPHEN 500 MG PO TABS
ORAL_TABLET | ORAL | Status: AC
  Administered 2019-07-16: 1000 mg via ORAL
  Filled 2019-07-16: qty 2

## 2019-07-16 MED ORDER — PROMETHAZINE HCL 25 MG/ML IJ SOLN: 6.2500 mg | INTRAMUSCULAR | Status: DC | PRN

## 2019-07-16 MED ORDER — BUPIVACAINE HCL (PF) 0.25 % IJ SOLN
INTRAMUSCULAR | Status: DC | PRN
  Administered 2019-07-16: 5 mL

## 2019-07-16 MED ORDER — FENTANYL CITRATE (PF) 250 MCG/5ML IJ SOLN
INTRAMUSCULAR | Status: AC
  Filled 2019-07-16: qty 5

## 2019-07-16 MED ORDER — 0.9 % SODIUM CHLORIDE (POUR BTL) OPTIME
TOPICAL | Status: DC | PRN
  Administered 2019-07-16: 1000 mL

## 2019-07-16 MED ORDER — LACTATED RINGERS IV SOLN: INTRAVENOUS | Status: DC

## 2019-07-16 MED ORDER — NITROFURANTOIN MONOHYD MACRO 100 MG PO CAPS
100.0000 mg | ORAL_CAPSULE | Freq: Two times a day (BID) | ORAL | Status: DC
  Administered 2019-07-16: 100 mg via ORAL
  Filled 2019-07-16 (×2): qty 1

## 2019-07-16 MED ORDER — SODIUM CHLORIDE 0.9 % IV SOLN
INTRAVENOUS | Status: DC | PRN
  Administered 2019-07-16: 500 mL

## 2019-07-16 MED ORDER — ONDANSETRON HCL 4 MG/2ML IJ SOLN: 4.0000 mg | Freq: Four times a day (QID) | INTRAMUSCULAR | Status: DC | PRN

## 2019-07-16 MED ORDER — LIDOCAINE 2% (20 MG/ML) 5 ML SYRINGE
INTRAMUSCULAR | Status: AC
  Filled 2019-07-16: qty 10

## 2019-07-16 MED ORDER — BIOTIN 10000 MCG PO TABS: 10000.0000 ug | ORAL_TABLET | Freq: Every day | ORAL | Status: DC

## 2019-07-16 MED ORDER — PROPOFOL 10 MG/ML IV BOLUS
INTRAVENOUS | Status: AC
  Filled 2019-07-16: qty 20

## 2019-07-16 MED ORDER — CEFAZOLIN SODIUM-DEXTROSE 2-4 GM/100ML-% IV SOLN
2.0000 g | INTRAVENOUS | Status: AC
  Administered 2019-07-16: 2 g via INTRAVENOUS

## 2019-07-16 MED ORDER — ACETAMINOPHEN 500 MG PO TABS: 1000.0000 mg | ORAL_TABLET | Freq: Once | ORAL | Status: AC

## 2019-07-16 MED ORDER — PHENAZOPYRIDINE HCL 100 MG PO TABS
95.0000 mg | ORAL_TABLET | Freq: Every day | ORAL | Status: DC
  Filled 2019-07-16: qty 1

## 2019-07-16 MED ORDER — LOSARTAN POTASSIUM 50 MG PO TABS
100.0000 mg | ORAL_TABLET | Freq: Every day | ORAL | Status: DC
  Administered 2019-07-16: 100 mg via ORAL
  Filled 2019-07-16: qty 2

## 2019-07-16 MED ORDER — NITROFURANTOIN MACROCRYSTAL 100 MG PO CAPS
100.0000 mg | ORAL_CAPSULE | Freq: Two times a day (BID) | ORAL | Status: DC
  Filled 2019-07-16: qty 1

## 2019-07-16 MED ORDER — ALENDRONATE SODIUM 70 MG PO TABS: 70.0000 mg | ORAL_TABLET | ORAL | Status: DC

## 2019-07-16 MED ORDER — BUPIVACAINE HCL (PF) 0.25 % IJ SOLN
INTRAMUSCULAR | Status: AC
  Filled 2019-07-16: qty 30

## 2019-07-16 MED ORDER — LIDOCAINE 2% (20 MG/ML) 5 ML SYRINGE
INTRAMUSCULAR | Status: DC | PRN
  Administered 2019-07-16: 60 mg via INTRAVENOUS

## 2019-07-16 MED ORDER — TURMERIC 500 MG PO CAPS: ORAL_CAPSULE | Freq: Every day | ORAL | Status: DC

## 2019-07-16 MED ORDER — DEXAMETHASONE SODIUM PHOSPHATE 10 MG/ML IJ SOLN
10.0000 mg | Freq: Once | INTRAMUSCULAR | Status: AC
  Administered 2019-07-16: 13:00:00 10 mg via INTRAVENOUS
  Filled 2019-07-16: qty 1

## 2019-07-16 MED ORDER — PHENYLEPHRINE 40 MCG/ML (10ML) SYRINGE FOR IV PUSH (FOR BLOOD PRESSURE SUPPORT)
PREFILLED_SYRINGE | INTRAVENOUS | Status: AC
  Filled 2019-07-16: qty 20

## 2019-07-16 MED ORDER — ACETAMINOPHEN 325 MG PO TABS
650.0000 mg | ORAL_TABLET | ORAL | Status: DC | PRN
  Administered 2019-07-16 – 2019-07-17 (×2): 650 mg via ORAL
  Filled 2019-07-16 (×2): qty 2

## 2019-07-16 MED ORDER — THROMBIN 5000 UNITS EX SOLR
CUTANEOUS | Status: DC | PRN
  Administered 2019-07-16 (×2): 5000 [IU] via TOPICAL

## 2019-07-16 MED ORDER — CYCLOBENZAPRINE HCL 10 MG PO TABS
10.0000 mg | ORAL_TABLET | Freq: Three times a day (TID) | ORAL | Status: DC | PRN
  Administered 2019-07-17: 10 mg via ORAL
  Filled 2019-07-16: qty 1

## 2019-07-16 MED ORDER — ACETAMINOPHEN 650 MG RE SUPP: 650.0000 mg | RECTAL | Status: DC | PRN

## 2019-07-16 MED ORDER — PROPOFOL 10 MG/ML IV BOLUS
INTRAVENOUS | Status: DC | PRN
  Administered 2019-07-16: 100 mg via INTRAVENOUS

## 2019-07-16 MED ORDER — ALUM & MAG HYDROXIDE-SIMETH 200-200-20 MG/5ML PO SUSP: 30.0000 mL | Freq: Four times a day (QID) | ORAL | Status: DC | PRN

## 2019-07-16 MED ORDER — SODIUM CHLORIDE 0.9 % IV SOLN: 250.0000 mL | INTRAVENOUS | Status: DC

## 2019-07-16 MED ORDER — CEFAZOLIN SODIUM-DEXTROSE 2-4 GM/100ML-% IV SOLN
2.0000 g | Freq: Three times a day (TID) | INTRAVENOUS | Status: AC
  Administered 2019-07-16 – 2019-07-17 (×2): 2 g via INTRAVENOUS
  Filled 2019-07-16 (×2): qty 100

## 2019-07-16 MED ORDER — RISAQUAD PO CAPS
1.0000 | ORAL_CAPSULE | Freq: Every day | ORAL | Status: DC
  Administered 2019-07-16: 1 via ORAL
  Filled 2019-07-16 (×2): qty 1

## 2019-07-16 MED ORDER — HEMOSTATIC AGENTS (NO CHARGE) OPTIME
TOPICAL | Status: DC | PRN
  Administered 2019-07-16: 1

## 2019-07-16 MED ORDER — ONDANSETRON HCL 4 MG PO TABS: 4.0000 mg | ORAL_TABLET | Freq: Four times a day (QID) | ORAL | Status: DC | PRN

## 2019-07-16 MED ORDER — ATORVASTATIN CALCIUM 10 MG PO TABS: 10.0000 mg | ORAL_TABLET | Freq: Every day | ORAL | Status: DC

## 2019-07-16 MED ORDER — LIDOCAINE-EPINEPHRINE 1 %-1:100000 IJ SOLN
INTRAMUSCULAR | Status: DC | PRN
  Administered 2019-07-16: 5 mL

## 2019-07-16 MED ORDER — ARIPIPRAZOLE 5 MG PO TABS
5.0000 mg | ORAL_TABLET | Freq: Every morning | ORAL | Status: DC
  Filled 2019-07-16: qty 1

## 2019-07-16 MED ORDER — PANTOPRAZOLE SODIUM 40 MG IV SOLR: 40.0000 mg | Freq: Every day | INTRAVENOUS | Status: DC

## 2019-07-16 MED ORDER — FENTANYL CITRATE (PF) 250 MCG/5ML IJ SOLN
INTRAMUSCULAR | Status: DC | PRN
  Administered 2019-07-16: 50 ug via INTRAVENOUS
  Administered 2019-07-16: 100 ug via INTRAVENOUS
  Administered 2019-07-16 (×2): 50 ug via INTRAVENOUS

## 2019-07-16 MED ORDER — SUCCINYLCHOLINE CHLORIDE 200 MG/10ML IV SOSY
PREFILLED_SYRINGE | INTRAVENOUS | Status: AC
  Filled 2019-07-16: qty 10

## 2019-07-16 SURGICAL SUPPLY — 59 items
ADH SKN CLS APL DERMABOND .7 (GAUZE/BANDAGES/DRESSINGS) ×1
APL SKNCLS STERI-STRIP NONHPOA (GAUZE/BANDAGES/DRESSINGS) ×1
BAG DECANTER FOR FLEXI CONT (MISCELLANEOUS) ×3 IMPLANT
BAND INSRT 18 STRL LF DISP RB (MISCELLANEOUS) ×2
BAND RUBBER #18 3X1/16 STRL (MISCELLANEOUS) ×6 IMPLANT
BENZOIN TINCTURE PRP APPL 2/3 (GAUZE/BANDAGES/DRESSINGS) ×3 IMPLANT
BLADE CLIPPER SURG (BLADE) IMPLANT
BLADE SURG 11 STRL SS (BLADE) ×3 IMPLANT
BUR CUTTER 7.0 ROUND (BURR) ×3 IMPLANT
BUR MATCHSTICK NEURO 3.0 LAGG (BURR) ×3 IMPLANT
CANISTER SUCT 3000ML PPV (MISCELLANEOUS) ×3 IMPLANT
CARTRIDGE OIL MAESTRO DRILL (MISCELLANEOUS) ×1 IMPLANT
CLOSURE WOUND 1/2 X4 (GAUZE/BANDAGES/DRESSINGS) ×1
COVER WAND RF STERILE (DRAPES) ×3 IMPLANT
DECANTER SPIKE VIAL GLASS SM (MISCELLANEOUS) ×3 IMPLANT
DERMABOND ADVANCED (GAUZE/BANDAGES/DRESSINGS) ×2
DERMABOND ADVANCED .7 DNX12 (GAUZE/BANDAGES/DRESSINGS) ×1 IMPLANT
DIFFUSER DRILL AIR PNEUMATIC (MISCELLANEOUS) ×3 IMPLANT
DRAPE HALF SHEET 40X57 (DRAPES) IMPLANT
DRAPE LAPAROTOMY 100X72X124 (DRAPES) ×3 IMPLANT
DRAPE MICROSCOPE LEICA (MISCELLANEOUS) ×3 IMPLANT
DRAPE SURG 17X23 STRL (DRAPES) ×3 IMPLANT
DRSG OPSITE POSTOP 4X6 (GAUZE/BANDAGES/DRESSINGS) ×2 IMPLANT
DURAPREP 26ML APPLICATOR (WOUND CARE) ×3 IMPLANT
ELECT REM PT RETURN 9FT ADLT (ELECTROSURGICAL) ×3
ELECTRODE REM PT RTRN 9FT ADLT (ELECTROSURGICAL) ×1 IMPLANT
GAUZE 4X4 16PLY RFD (DISPOSABLE) IMPLANT
GAUZE SPONGE 4X4 12PLY STRL (GAUZE/BANDAGES/DRESSINGS) ×3 IMPLANT
GLOVE BIO SURGEON STRL SZ7 (GLOVE) IMPLANT
GLOVE BIO SURGEON STRL SZ8 (GLOVE) ×3 IMPLANT
GLOVE BIOGEL PI IND STRL 7.0 (GLOVE) IMPLANT
GLOVE BIOGEL PI IND STRL 8 (GLOVE) IMPLANT
GLOVE BIOGEL PI INDICATOR 7.0 (GLOVE)
GLOVE BIOGEL PI INDICATOR 8 (GLOVE) ×4
GLOVE ECLIPSE 7.5 STRL STRAW (GLOVE) ×8 IMPLANT
GLOVE EXAM NITRILE XL STR (GLOVE) ×2 IMPLANT
GLOVE INDICATOR 8.5 STRL (GLOVE) ×3 IMPLANT
GOWN STRL REUS W/ TWL LRG LVL3 (GOWN DISPOSABLE) ×1 IMPLANT
GOWN STRL REUS W/ TWL XL LVL3 (GOWN DISPOSABLE) ×2 IMPLANT
GOWN STRL REUS W/TWL 2XL LVL3 (GOWN DISPOSABLE) ×4 IMPLANT
GOWN STRL REUS W/TWL LRG LVL3 (GOWN DISPOSABLE) ×3
GOWN STRL REUS W/TWL XL LVL3 (GOWN DISPOSABLE) ×6
KIT BASIN OR (CUSTOM PROCEDURE TRAY) ×3 IMPLANT
KIT TURNOVER KIT B (KITS) ×3 IMPLANT
NDL SPNL 22GX3.5 QUINCKE BK (NEEDLE) ×1 IMPLANT
NEEDLE HYPO 22GX1.5 SAFETY (NEEDLE) ×3 IMPLANT
NEEDLE SPNL 22GX3.5 QUINCKE BK (NEEDLE) ×3 IMPLANT
NS IRRIG 1000ML POUR BTL (IV SOLUTION) ×3 IMPLANT
OIL CARTRIDGE MAESTRO DRILL (MISCELLANEOUS) ×3
PACK LAMINECTOMY NEURO (CUSTOM PROCEDURE TRAY) ×3 IMPLANT
SPONGE SURGIFOAM ABS GEL SZ50 (HEMOSTASIS) ×3 IMPLANT
STRIP CLOSURE SKIN 1/2X4 (GAUZE/BANDAGES/DRESSINGS) ×2 IMPLANT
SUT VIC AB 0 CT1 18XCR BRD8 (SUTURE) ×1 IMPLANT
SUT VIC AB 0 CT1 8-18 (SUTURE) ×3
SUT VIC AB 2-0 CT1 18 (SUTURE) ×3 IMPLANT
SUT VICRYL 4-0 PS2 18IN ABS (SUTURE) ×3 IMPLANT
TOWEL GREEN STERILE (TOWEL DISPOSABLE) ×3 IMPLANT
TOWEL GREEN STERILE FF (TOWEL DISPOSABLE) ×3 IMPLANT
WATER STERILE IRR 1000ML POUR (IV SOLUTION) ×3 IMPLANT

## 2019-07-16 NOTE — Transfer of Care (Signed)
Immediate Anesthesia Transfer of Care Note  Patient: Karen Hancock  Procedure(s) Performed: Laminectomy and Foraminotomy - Lumbar Three-Lumbar Four - bilateral - Lumbar Four-Lumbar Five - right (Bilateral Back)  Patient Location: PACU  Anesthesia Type:General  Level of Consciousness: awake, alert  and patient cooperative  Airway & Oxygen Therapy: Patient Spontanous Breathing  Post-op Assessment: Report given to RN and Post -op Vital signs reviewed and stable  Post vital signs: Reviewed and stable  Last Vitals:  Vitals Value Taken Time  BP 139/87 07/16/19 1446  Temp    Pulse 102 07/16/19 1448  Resp 26 07/16/19 1448  SpO2 95 % 07/16/19 1448  Vitals shown include unvalidated device data.  Last Pain:  Vitals:   07/16/19 1040  TempSrc:   PainSc: 3       Patients Stated Pain Goal: 1 ([id number])  Complications: No apparent anesthesia complications

## 2019-07-16 NOTE — Progress Notes (Addendum)
PHARMACIST - PHYSICIAN ORDER COMMUNICATION  CONCERNING: P&T Medication Policy on Herbal Medications  DESCRIPTION:  This patient's order for Biotin and Tumeric has been noted.  This product(s) is classified as an "herbal" or natural product. Due to a lack of definitive safety studies or FDA approval, nonstandard manufacturing practices, plus the potential risk of unknown drug-drug interactions while on inpatient medications, the Pharmacy and Therapeutics Committee does not permit the use of "herbal" or natural products of this type within [facility].   ACTION TAKEN: The pharmacy department is unable to verify this order at this time and your patient has been informed of this safety policy. Please reevaluate patient's clinical condition at discharge and address if the herbal or natural product(s) should be resumed at that time.  Nicole Cella, RPh Clinical Pharmacist 07/16/2019 5:14 PM    ADDENDUM:   PHARMACIST - PHYSICIAN COMMUNICATION  CONCERNING: P&T Medication Policy Regarding Oral Bisphosphonates  RECOMMENDATION: Your order for alendronate (Fosamax), ibandronate (Boniva), or risedronate (Actonel) has been discontinued at this time.  If the patient's post-hospital medical condition warrants safe use of this class of drugs, please resume the pre-hospital regimen upon discharge.  DESCRIPTION:  Alendronate (Fosamax), ibandronate (Boniva), and risedronate (Actonel) can cause severe esophageal erosions in patients who are unable to remain upright at least 30 minutes after taking this medication.   Since brief interruptions in therapy are thought to have minimal impact on bone mineral density, the [place] has established that bisphosphonate orders should be routinely discontinued during hospitalization.   To override this safety policy and permit administration of Boniva, Fosamax, or Actonel in the hospital, prescribers must write "DO NOT HOLD" in the  comments section when placing the order for this class of medications.  Nicole Cella, RPh Clinical Pharmacist  07/16/2019  5:21 PM

## 2019-07-16 NOTE — Op Note (Signed)
Preoperative diagnosis: Severe lumbar spinal stenosis L3-4 bilateral and L4-5 on the right with bilateral L4 right L5 radiculopathies  Postoperative diagnosis: Same  Procedure: #1 bilateral decompressive lumbar laminectomy L3-4 with partial medial facetectomies and foraminotomies of the L3 and L4 nerve roots bilaterally with microscopic foraminotomies and inspection of the disc base  2.  Right-sided decompressive laminotomy L4-5 with microdissection of the right L5 nerve root microscopic foraminotomy  Surgeon: Dominica Severin Luiscarlos Kaczmarczyk  Assistant: Nash Shearer  Anesthesia: General  EBL: Minimal  HPI: 69 year old female with progressive worsening bilateral hip and leg pain worse on the right with neurogenic claudication work-up revealed severe thecal sac compression at L3-4 as well as severe lateral recess stenosis L4-5 on the right.  Due to patient's progression of clinical syndrome imaging findings and failed conservative treatment I recommended decompressive laminectomy bilaterally at L3-4 and right-sided L4-5.  I extensively went over the risks and benefits of the procedure with her as well as perioperative course expectations of outcome and alternatives of surgery and she understood and agreed to proceed forward.  Operative procedure: Patient was brought into the OR was used and general anesthesia positioned prone the Wilson frame her back was prepped and draped in routine sterile fashion preoperative x-ray localized the appropriate level so after infiltration of 10 cc lidocaine with epi midline incision was made and Bovie electrocautery was used to take down the subcutaneous tissue and subperiosteal dissection was carried lamina of L3-4 bilaterally and L4-5 on the right.  Intraoperative x-ray confirmed identification appropriate level so the spinous process at L3 was removed central decompression was begun there was severe hourglass compression of thecal sac with large medial projecting spurs coming of both  feet 3 4 facet complexes.  These were teased off of the dura and removed in piecemeal fashion with a 2 and 3 mm Kerrison punch.  After I achieved complete central decompression under biting the medial facet complex identified the L3 nerve root in the L3 foramen open up that foramen under bit the large spur coming off the medial facet complex bilaterally and opened up the L4 foramen as well.  I did inspect the disc at L3-4 on the left this was not her predominantly symptomatic side however the MRI scan did show a disc bulge over there however I did not feel that that disc bulge was compressive after I alleviated all the extensive spurring she had and due to the fact that she had significant spinal stenosis and degenerative scoliosis I felt not cutting into the disc base with help preserve some of her stability and left it alone.  After adequate decompression been achieved at L3-4 I then exposed L4-5 performed a laminotomy at L4-5 there was a large medial projecting spur coming off the facet at L4-5 under bit that identified the L5 pedicle and unroofed the L5 nerve.  The foramina easily excepted a coronary dilator as expected the disc base it was not felt to be compressive left alone the wounds and copiously irrigated meticulous hemostasis was maintained Gelfoam was ON top of the dura the muscle fascia approximate layers with Vicryl skin was closed running 4 subcuticular Dermabond benzoin Steri-Strips and a sterile dressing was applied patient recovery room in stable condition.  At the end the case all needle count sponge counts were correct.

## 2019-07-16 NOTE — Anesthesia Procedure Notes (Signed)
Procedure Name: Intubation Date/Time: 07/16/2019 12:47 PM Performed by: Janace Litten, CRNA Pre-anesthesia Checklist: Patient identified, Emergency Drugs available, Suction available and Patient being monitored Patient Re-evaluated:Patient Re-evaluated prior to induction Oxygen Delivery Method: Circle System Utilized Preoxygenation: Pre-oxygenation with 100% oxygen Induction Type: IV induction Ventilation: Mask ventilation without difficulty Laryngoscope Size: Mac and 3 Grade View: Grade II Tube type: Oral Tube size: 7.0 mm Number of attempts: 1 Airway Equipment and Method: Stylet Placement Confirmation: ETT inserted through vocal cords under direct vision,  positive ETCO2 and breath sounds checked- equal and bilateral Secured at: 22 cm Tube secured with: Tape Dental Injury: Teeth and Oropharynx as per pre-operative assessment

## 2019-07-16 NOTE — H&P (Signed)
Karen Hancock is an 69 y.o. female.   Chief Complaint: Back right greater than left leg pain HPI: 69 year old female with back pain bilateral hip and leg pain consistent with L4-L5 nerve root pattern work-up revealed severe spinal stenosis at L3-4 moderate foraminal stenosis L4-5 patient has neurogenic claudication and radicular pain is refractory to conservative treatment.  Due to patient's progression of clinical syndrome imaging findings and failed conservative treatment I recommended decompressive laminectomy bilaterally at L3-4 and a right-sided laminotomy at L4-5 I extensively gone over the risks and benefits of that operation with her as well as perioperative course expectations of outcome and alternatives of surgery and she understands and agrees to proceed forward.  Past Medical History:  Diagnosis Date  . ADD (attention deficit disorder)   . Anxiety   . Cervical dysplasia   . Family history of adverse reaction to anesthesia    Per pt, her mother used to have PONV   . Hypertension   . Osteopenia     Past Surgical History:  Procedure Laterality Date  . APPENDECTOMY    . BREAST EXCISIONAL BIOPSY    . BREAST SURGERY     Biopsy-benign  . COLPOSCOPY    . GYNECOLOGIC CRYOSURGERY    . HERNIA REPAIR      Family History  Problem Relation Age of Onset  . Hypertension Mother   . Diabetes Mother   . Hypertension Father   . Diabetes Sister   . Breast cancer Maternal Aunt        Great aunt-Age 63  . Uterine cancer Paternal Aunt    Social History:  reports that she has quit smoking. She has never used smokeless tobacco. She reports current alcohol use of about 4.0 standard drinks of alcohol per week. She reports that she does not use drugs.  Allergies: No Known Allergies  Medications Prior to Admission  Medication Sig Dispense Refill  . ABILIFY 5 MG tablet Take 5 mg by mouth every morning.    Marland Kitchen alendronate (FOSAMAX) 70 MG tablet Take 70 mg by mouth once a week. Take with a full  glass of water on an empty stomach.    Marland Kitchen atorvastatin (LIPITOR) 10 MG tablet Take 10 mg by mouth daily.    . Biotin 10000 MCG TABS Take 10,000 mcg by mouth daily.    . Cholecalciferol (VITAMIN D) 125 MCG (5000 UT) CAPS Take 5,000 Units by mouth daily.     . CYMBALTA 30 MG capsule Take 90 mg by mouth every morning.    Marland Kitchen losartan (COZAAR) 100 MG tablet Take 100 mg by mouth daily.     . nitrofurantoin (MACRODANTIN) 100 MG capsule Take 100 mg by mouth 2 (two) times daily.    . Phenazopyridine HCl (AZO TABS PO) Take 1 tablet by mouth daily.    . Probiotic Product (PROBIOTIC-10 PO) Take 1 capsule by mouth daily.    . traZODone (DESYREL) 100 MG tablet Take 100 mg by mouth at bedtime.    . TURMERIC PO Take 1 tablet by mouth daily.    Marland Kitchen atovaquone-proguanil (MALARONE) 250-100 MG TABS tablet Take 1 tablet by mouth daily. Start 2 days prior to travel to malaria area, throughout travel and for 7 days upon return. (Patient not taking: Reported on 06/27/2019) 21 tablet 0  . ciprofloxacin (CIPRO) 500 MG tablet Take 1 tablet (500 mg total) by mouth 2 (two) times daily. Take 1-2 days until diarrhea resolves (Patient not taking: Reported on 06/27/2019) 6 tablet 0  No results found for this or any previous visit (from the past 48 hour(s)). No results found.  Review of Systems  Musculoskeletal: Positive for back pain.  Neurological: Positive for numbness.    Blood pressure (!) 140/96, pulse (!) 112, temperature 98.4 F (36.9 C), temperature source Oral, resp. rate 20, SpO2 98 %. Physical Exam  Constitutional: She is oriented to person, place, and time. She appears well-developed.  HENT:  Head: Normocephalic.  Eyes: Pupils are equal, round, and reactive to light.  Respiratory: Effort normal.  GI: Soft.  Musculoskeletal:     Cervical back: Normal range of motion.  Neurological: She is alert and oriented to person, place, and time. GCS eye subscore is 4. GCS verbal subscore is 5. GCS motor subscore is 6.   Patient is awake and alert strength is 5 and 5 iliopsoas, quads, hamstrings, gastrocs, into tibialis, EHL.  Skin: Skin is warm and dry.     Assessment/Plan 69 year old presents for bilateral decompressive laminectomy L3-4 right-sided L4-5  Jamaris Biernat P, MD 07/16/2019, 12:31 PM

## 2019-07-16 NOTE — Evaluation (Signed)
Physical Therapy Evaluation and Discharge Patient Details Name: Karen Hancock MRN: [id number] DOB: 11-15-1950 Today's Date: 07/16/2019   History of Present Illness  Pt is a 69 y/o female s/p L3-5 laminectomy. PMH includes HTN.   Clinical Impression  Patient evaluated by Physical Therapy with no further acute PT needs identified. All education has been completed and the patient has no further questions. Pt overall steady with gait and stair training, requiring min guard to supervision for safety. Educated about back precautions, walking program and how to maintain during ADLs/car transfers. Reports daughter will be staying with her at d/c. See below for any follow-up Physical Therapy or equipment needs. PT is signing off. Thank you for this referral. If needs change, please re-consult.      Follow Up Recommendations Other (comment);Supervision for mobility/OOB(would benefit from outpatient PT once cleared by MD)    Equipment Recommendations  3in1 (PT)    Recommendations for Other Services       Precautions / Restrictions Precautions Precautions: Back Precaution Booklet Issued: Yes (comment) Precaution Comments: Reviewed back precautions, generalized walking program, and how to maintain precautions with ADLs and car transfers.  Restrictions Weight Bearing Restrictions: No      Mobility  Bed Mobility Overal bed mobility: Needs Assistance Bed Mobility: Sidelying to Sit;Rolling;Sit to Sidelying Rolling: Supervision Sidelying to sit: Supervision     Sit to sidelying: Supervision General bed mobility comments: Supervision for safety. Cues for log roll technique to maintain precautions.   Transfers Overall transfer level: Needs assistance Equipment used: None Transfers: Sit to/from Stand Sit to Stand: Supervision         General transfer comment: Supervision for safety.   Ambulation/Gait Ambulation/Gait assistance: Supervision Gait Distance (Feet): 250 Feet Assistive  device: None;IV Pole Gait Pattern/deviations: Step-through pattern;Decreased stride length Gait velocity: Decreased   General Gait Details: Guarded gait, however, overall steady. No LOB noted. Educated about generalized walking program to perform at home.   Stairs Stairs: Yes Stairs assistance: Supervision;Min guard Stair Management: One rail Left;Step to pattern;Sideways Number of Stairs: 6 General stair comments: Sideways stair navigation to increase safety. Cues for LE sequencing. Educated about having supervision for safety initially upon return home.   Wheelchair Mobility    Modified Rankin (Stroke Patients Only)       Balance Overall balance assessment: No apparent balance deficits (not formally assessed)                                           Pertinent Vitals/Pain Pain Assessment: Faces Faces Pain Scale: Hurts a little bit Pain Location: back Pain Descriptors / Indicators: Operative site guarding Pain Intervention(s): Limited activity within patient's tolerance;Monitored during session;Repositioned    Home Living Family/patient expects to be discharged to:: Private residence Living Arrangements: Alone Available Help at Discharge: Family;Available 24 hours/day Type of Home: House Home Access: Stairs to enter Entrance Stairs-Rails: Left Entrance Stairs-Number of Steps: 3 Home Layout: Two level Home Equipment: None Additional Comments: Daughter is going to stay with pt.     Prior Function Level of Independence: Independent               Hand Dominance        Extremity/Trunk Assessment   Upper Extremity Assessment Upper Extremity Assessment: Overall WFL for tasks assessed    Lower Extremity Assessment Lower Extremity Assessment: Overall WFL for tasks assessed(Pt reports numbness and pain  has improved. )    Cervical / Trunk Assessment Cervical / Trunk Assessment: Other exceptions Cervical / Trunk Exceptions: s/p lumbar  surgery  Communication   Communication: No difficulties  Cognition Arousal/Alertness: Awake/alert Behavior During Therapy: WFL for tasks assessed/performed Overall Cognitive Status: Within Functional Limits for tasks assessed                                        General Comments General comments (skin integrity, edema, etc.): Pt's daughter present during session     Exercises     Assessment/Plan    PT Assessment Patent does not need any further PT services  PT Problem List         PT Treatment Interventions      PT Goals (Current goals can be found in the Care Plan section)  Acute Rehab PT Goals Patient Stated Goal: to go home PT Goal Formulation: With patient Time For Goal Achievement: 07/16/19 Potential to Achieve Goals: Good    Frequency     Barriers to discharge        Co-evaluation               AM-PAC PT "6 Clicks" Mobility  Outcome Measure Help needed turning from your back to your side while in a flat bed without using bedrails?: None Help needed moving from lying on your back to sitting on the side of a flat bed without using bedrails?: None Help needed moving to and from a bed to a chair (including a wheelchair)?: None Help needed standing up from a chair using your arms (e.g., wheelchair or bedside chair)?: None Help needed to walk in hospital room?: None Help needed climbing 3-5 steps with a railing? : A Little 6 Click Score: 23    End of Session Equipment Utilized During Treatment: Gait belt Activity Tolerance: Patient tolerated treatment well Patient left: in bed;with call bell/phone within reach;with family/visitor present Nurse Communication: Mobility status PT Visit Diagnosis: Other abnormalities of gait and mobility (R26.89)    Time: [id number] PT Time Calculation (min) (ACUTE ONLY): 23 min   Charges:   PT Evaluation $PT Eval Low Complexity: 1 Low PT Treatments $Gait Training: 8-22 mins        Lou Miner, DPT  Acute Rehabilitation Services  Pager: [phone] Office: [phone]   Rudean Hitt 07/16/2019, 5:57 PM

## 2019-07-16 NOTE — Anesthesia Postprocedure Evaluation (Signed)
Anesthesia Post Note  Patient: Karen Hancock  Procedure(s) Performed: Laminectomy and Foraminotomy - Lumbar Three-Lumbar Four - bilateral - Lumbar Four-Lumbar Five - right (Bilateral Back)     Patient location during evaluation: PACU Anesthesia Type: General Level of consciousness: awake and alert Pain management: pain level controlled Vital Signs Assessment: post-procedure vital signs reviewed and stable Respiratory status: spontaneous breathing, nonlabored ventilation, respiratory function stable and patient connected to nasal cannula oxygen Cardiovascular status: blood pressure returned to baseline and stable Postop Assessment: no apparent nausea or vomiting Anesthetic complications: no    Last Vitals:  Vitals:   07/16/19 1517 07/16/19 1540  BP: 110/79 137/83  Pulse: 90 87  Resp: 18 18  Temp: (!) 36.2 C 36.8 C  SpO2: 97% 97%    Last Pain:  Vitals:   07/16/19 1540  TempSrc: Oral  PainSc:                  Tiajuana Amass

## 2019-07-17 DIAGNOSIS — F419 Anxiety disorder, unspecified: Secondary | ICD-10-CM | POA: Diagnosis not present

## 2019-07-17 DIAGNOSIS — M48061 Spinal stenosis, lumbar region without neurogenic claudication: Secondary | ICD-10-CM | POA: Diagnosis not present

## 2019-07-17 DIAGNOSIS — I10 Essential (primary) hypertension: Secondary | ICD-10-CM | POA: Diagnosis not present

## 2019-07-17 DIAGNOSIS — Z87891 Personal history of nicotine dependence: Secondary | ICD-10-CM | POA: Diagnosis not present

## 2019-07-17 DIAGNOSIS — Z79899 Other long term (current) drug therapy: Secondary | ICD-10-CM | POA: Diagnosis not present

## 2019-07-17 DIAGNOSIS — M5416 Radiculopathy, lumbar region: Secondary | ICD-10-CM | POA: Diagnosis not present

## 2019-07-17 NOTE — Discharge Instructions (Signed)

## 2019-07-17 NOTE — Discharge Summary (Signed)
Physician Discharge Summary  Patient ID: Karen Hancock MRN: [id number] DOB/AGE: 1950/04/23 69 y.o. Estimated body mass index is 21.55 kg/m as calculated from the following:   Height as of 07/09/19: 5\' 7"  (1.702 m).   Weight as of 07/09/19: 62.4 kg.   Admit date: 07/16/2019 Discharge date: 07/17/2019  Admission Diagnoses: Lumbar spinal stenosis L3-4 L4-5  Discharge Diagnoses: Same Active Problems:   Spinal stenosis at L4-L5 level   Discharged Condition: good  Hospital Course: Patient is admitted to hospital underwent decompressive laminectomy bilaterally at L3-4 and right-sided L4-5.  Postoperative patient very well recovering the floor on the floor was ambulating voiding spontaneously tolerating regular diet and stable for discharge home.  Consults: Significant Diagnostic Studies: Treatments: Bilateral decompressive laminectomy L3-4 right-sided decompression L4-5 Discharge Exam: Blood pressure 121/76, pulse 79, temperature 98.2 F (36.8 C), temperature source Oral, resp. rate 14, SpO2 99 %. Strength 5 out of 5 wound clean dry and intact  Disposition: Home   Allergies as of 07/17/2019   No Known Allergies     Medication List    TAKE these medications   Abilify 5 MG tablet Generic drug: ARIPiprazole Take 5 mg by mouth every morning.   alendronate 70 MG tablet Commonly known as: FOSAMAX Take 70 mg by mouth once a week. Take with a full glass of water on an empty stomach.   atorvastatin 10 MG tablet Commonly known as: LIPITOR Take 10 mg by mouth daily.   atovaquone-proguanil 250-100 MG Tabs tablet Commonly known as: MALARONE Take 1 tablet by mouth daily. Start 2 days prior to travel to malaria area, throughout travel and for 7 days upon return.   AZO TABS PO Take 1 tablet by mouth daily.   Biotin 10000 MCG Tabs Take 10,000 mcg by mouth daily.   ciprofloxacin 500 MG tablet Commonly known as: Cipro Take 1 tablet (500 mg total) by mouth 2 (two) times daily. Take  1-2 days until diarrhea resolves   Cymbalta 30 MG capsule Generic drug: DULoxetine Take 90 mg by mouth every morning.   losartan 100 MG tablet Commonly known as: COZAAR Take 100 mg by mouth daily.   nitrofurantoin 100 MG capsule Commonly known as: MACRODANTIN Take 100 mg by mouth 2 (two) times daily.   PROBIOTIC-10 PO Take 1 capsule by mouth daily.   traZODone 100 MG tablet Commonly known as: DESYREL Take 100 mg by mouth at bedtime.   TURMERIC PO Take 1 tablet by mouth daily.   Vitamin D 125 MCG (5000 UT) Caps Take 5,000 Units by mouth daily.        Signed: Diedre Maclellan P 07/17/2019, 7:41 AM

## 2019-07-17 NOTE — Plan of Care (Signed)
Pt doing well. Pt and daughter given D/C instructions with verbal understanding. Pt's incision is clean and dry with no sign of infection. Pt's IV was removed prior to D/C. Pt D/C'd home via wheelchair per MD order. Pt is stable @ D/C and has no other needs at this time. Shadie Sweatman, RN  

## 2019-08-19 ENCOUNTER — Other Ambulatory Visit: Payer: Self-pay | Admitting: Family Medicine

## 2019-08-19 DIAGNOSIS — N6489 Other specified disorders of breast: Secondary | ICD-10-CM

## 2019-09-02 DIAGNOSIS — M545 Low back pain: Secondary | ICD-10-CM | POA: Diagnosis not present

## 2019-09-05 DIAGNOSIS — M545 Low back pain: Secondary | ICD-10-CM | POA: Diagnosis not present

## 2019-09-05 DIAGNOSIS — G8918 Other acute postprocedural pain: Secondary | ICD-10-CM | POA: Diagnosis not present

## 2019-09-10 DIAGNOSIS — M545 Low back pain: Secondary | ICD-10-CM | POA: Diagnosis not present

## 2019-09-10 DIAGNOSIS — G8918 Other acute postprocedural pain: Secondary | ICD-10-CM | POA: Diagnosis not present

## 2019-09-12 DIAGNOSIS — M545 Low back pain: Secondary | ICD-10-CM | POA: Diagnosis not present

## 2019-09-12 DIAGNOSIS — G8918 Other acute postprocedural pain: Secondary | ICD-10-CM | POA: Diagnosis not present

## 2019-10-08 ENCOUNTER — Ambulatory Visit
Admission: RE | Admit: 2019-10-08 | Discharge: 2019-10-08 | Disposition: A | Discharge status: Home / Self Care | Payer: Medicare Other | Source: Ambulatory Visit | Attending: Family Medicine | Admitting: Family Medicine

## 2019-10-08 ENCOUNTER — Other Ambulatory Visit: Payer: Self-pay

## 2019-10-08 DIAGNOSIS — N6489 Other specified disorders of breast: Secondary | ICD-10-CM

## 2019-10-22 ENCOUNTER — Ambulatory Visit: Payer: PRIVATE HEALTH INSURANCE | Attending: Internal Medicine

## 2019-10-22 DIAGNOSIS — Z20822 Contact with and (suspected) exposure to covid-19: Secondary | ICD-10-CM | POA: Diagnosis not present

## 2019-10-23 LAB — SARS-COV-2, NAA 2 DAY TAT

## 2019-10-23 LAB — NOVEL CORONAVIRUS, NAA: SARS-CoV-2, NAA: NOT DETECTED

## 2019-10-25 DIAGNOSIS — N390 Urinary tract infection, site not specified: Secondary | ICD-10-CM | POA: Diagnosis not present

## 2019-10-30 DIAGNOSIS — K469 Unspecified abdominal hernia without obstruction or gangrene: Secondary | ICD-10-CM | POA: Diagnosis not present

## 2019-10-30 DIAGNOSIS — Z681 Body mass index (BMI) 19 or less, adult: Secondary | ICD-10-CM | POA: Diagnosis not present

## 2019-11-19 DIAGNOSIS — I10 Essential (primary) hypertension: Secondary | ICD-10-CM | POA: Diagnosis not present

## 2019-11-19 DIAGNOSIS — M810 Age-related osteoporosis without current pathological fracture: Secondary | ICD-10-CM | POA: Diagnosis not present

## 2019-11-19 DIAGNOSIS — E785 Hyperlipidemia, unspecified: Secondary | ICD-10-CM | POA: Diagnosis not present

## 2019-11-19 DIAGNOSIS — E559 Vitamin D deficiency, unspecified: Secondary | ICD-10-CM | POA: Diagnosis not present

## 2019-11-24 DIAGNOSIS — I10 Essential (primary) hypertension: Secondary | ICD-10-CM | POA: Diagnosis not present

## 2019-11-24 DIAGNOSIS — E785 Hyperlipidemia, unspecified: Secondary | ICD-10-CM | POA: Diagnosis not present

## 2019-11-24 DIAGNOSIS — Z6820 Body mass index (BMI) 20.0-20.9, adult: Secondary | ICD-10-CM | POA: Diagnosis not present

## 2019-11-24 DIAGNOSIS — F5101 Primary insomnia: Secondary | ICD-10-CM | POA: Diagnosis not present

## 2019-11-24 DIAGNOSIS — M810 Age-related osteoporosis without current pathological fracture: Secondary | ICD-10-CM | POA: Diagnosis not present

## 2019-11-24 DIAGNOSIS — Z23 Encounter for immunization: Secondary | ICD-10-CM | POA: Diagnosis not present

## 2019-11-24 DIAGNOSIS — Z0000 Encounter for general adult medical examination without abnormal findings: Secondary | ICD-10-CM | POA: Diagnosis not present

## 2019-11-24 DIAGNOSIS — F325 Major depressive disorder, single episode, in full remission: Secondary | ICD-10-CM | POA: Diagnosis not present

## 2019-11-25 ENCOUNTER — Other Ambulatory Visit: Payer: Self-pay | Admitting: Family Medicine

## 2019-11-25 DIAGNOSIS — M810 Age-related osteoporosis without current pathological fracture: Secondary | ICD-10-CM

## 2019-12-05 DIAGNOSIS — K4190 Unilateral femoral hernia, without obstruction or gangrene, not specified as recurrent: Secondary | ICD-10-CM | POA: Diagnosis not present

## 2019-12-17 DIAGNOSIS — F3341 Major depressive disorder, recurrent, in partial remission: Secondary | ICD-10-CM | POA: Diagnosis not present

## 2019-12-20 DIAGNOSIS — Z23 Encounter for immunization: Secondary | ICD-10-CM | POA: Diagnosis not present

## 2020-01-13 DIAGNOSIS — H2513 Age-related nuclear cataract, bilateral: Secondary | ICD-10-CM | POA: Diagnosis not present

## 2020-01-13 DIAGNOSIS — H04123 Dry eye syndrome of bilateral lacrimal glands: Secondary | ICD-10-CM | POA: Diagnosis not present

## 2020-01-13 DIAGNOSIS — H524 Presbyopia: Secondary | ICD-10-CM | POA: Diagnosis not present

## 2020-01-14 ENCOUNTER — Ambulatory Visit (INDEPENDENT_AMBULATORY_CARE_PROVIDER_SITE_OTHER): Payer: Medicare Other | Admitting: Psychology

## 2020-01-14 DIAGNOSIS — F411 Generalized anxiety disorder: Secondary | ICD-10-CM | POA: Diagnosis not present

## 2020-01-21 DIAGNOSIS — L821 Other seborrheic keratosis: Secondary | ICD-10-CM | POA: Diagnosis not present

## 2020-01-21 DIAGNOSIS — L84 Corns and callosities: Secondary | ICD-10-CM | POA: Diagnosis not present

## 2020-01-21 DIAGNOSIS — D1801 Hemangioma of skin and subcutaneous tissue: Secondary | ICD-10-CM | POA: Diagnosis not present

## 2020-01-21 DIAGNOSIS — D225 Melanocytic nevi of trunk: Secondary | ICD-10-CM | POA: Diagnosis not present

## 2020-01-21 DIAGNOSIS — Z85828 Personal history of other malignant neoplasm of skin: Secondary | ICD-10-CM | POA: Diagnosis not present

## 2020-01-21 DIAGNOSIS — L814 Other melanin hyperpigmentation: Secondary | ICD-10-CM | POA: Diagnosis not present

## 2020-01-26 ENCOUNTER — Ambulatory Visit: Payer: Medicare Other | Admitting: Psychology

## 2020-01-28 DIAGNOSIS — F3341 Major depressive disorder, recurrent, in partial remission: Secondary | ICD-10-CM | POA: Diagnosis not present

## 2020-01-28 DIAGNOSIS — F411 Generalized anxiety disorder: Secondary | ICD-10-CM | POA: Diagnosis not present

## 2020-01-29 ENCOUNTER — Ambulatory Visit: Payer: Self-pay | Admitting: General Surgery

## 2020-02-05 ENCOUNTER — Encounter (HOSPITAL_BASED_OUTPATIENT_CLINIC_OR_DEPARTMENT_OTHER): Payer: Self-pay | Admitting: General Surgery

## 2020-02-05 ENCOUNTER — Other Ambulatory Visit: Payer: Self-pay

## 2020-02-05 NOTE — Progress Notes (Signed)
Spoke w/ via phone for pre-op interview--- PT Lab needs dos---- [place]              Lab results------ current ekg dated 07-09-2019 in epic/ chart COVID test ------ 02-10-2020@ 1000 Arrive at ------- 4680 NPO after MN NO Solid Food.  Clear liquids from MN until--- 1145 Medications to take morning of surgery ----- Cymbalta, Lipitor, Abilify Diabetic medication ----- n/a Patient Special Instructions ----- n/a Pre-Op special Istructions ----- n/a Patient verbalized understanding of instructions that were given at this phone interview. Patient denies shortness of breath, chest pain, fever, cough at this phone interview.

## 2020-02-09 ENCOUNTER — Ambulatory Visit (INDEPENDENT_AMBULATORY_CARE_PROVIDER_SITE_OTHER): Payer: Medicare Other | Admitting: Psychology

## 2020-02-09 ENCOUNTER — Other Ambulatory Visit (HOSPITAL_COMMUNITY): Payer: Medicare Other

## 2020-02-09 DIAGNOSIS — F411 Generalized anxiety disorder: Secondary | ICD-10-CM

## 2020-02-10 ENCOUNTER — Other Ambulatory Visit (HOSPITAL_COMMUNITY)
Admission: RE | Admit: 2020-02-10 | Discharge: 2020-02-10 | Disposition: A | Discharge status: Home / Self Care | Payer: Medicare Other | Source: Ambulatory Visit | Attending: General Surgery | Admitting: General Surgery

## 2020-02-10 DIAGNOSIS — Z01812 Encounter for preprocedural laboratory examination: Secondary | ICD-10-CM | POA: Diagnosis not present

## 2020-02-10 DIAGNOSIS — Z20822 Contact with and (suspected) exposure to covid-19: Secondary | ICD-10-CM | POA: Insufficient documentation

## 2020-02-10 LAB — SARS CORONAVIRUS 2 (TAT 6-24 HRS): SARS Coronavirus 2: NEGATIVE

## 2020-02-12 ENCOUNTER — Ambulatory Visit (HOSPITAL_BASED_OUTPATIENT_CLINIC_OR_DEPARTMENT_OTHER)
Admission: RE | Admit: 2020-02-12 | Discharge: 2020-02-12 | Disposition: A | Discharge status: Home / Self Care | Payer: Medicare Other | Attending: General Surgery | Admitting: General Surgery

## 2020-02-12 ENCOUNTER — Encounter (HOSPITAL_BASED_OUTPATIENT_CLINIC_OR_DEPARTMENT_OTHER): Payer: Self-pay | Admitting: General Surgery

## 2020-02-12 ENCOUNTER — Other Ambulatory Visit: Payer: Self-pay

## 2020-02-12 ENCOUNTER — Encounter (HOSPITAL_BASED_OUTPATIENT_CLINIC_OR_DEPARTMENT_OTHER)
Admission: RE | Disposition: A | Discharge status: Home / Self Care | Payer: Self-pay | Source: Home / Self Care | Attending: General Surgery

## 2020-02-12 ENCOUNTER — Ambulatory Visit (HOSPITAL_BASED_OUTPATIENT_CLINIC_OR_DEPARTMENT_OTHER): Payer: Medicare Other | Admitting: Anesthesiology

## 2020-02-12 DIAGNOSIS — F419 Anxiety disorder, unspecified: Secondary | ICD-10-CM | POA: Diagnosis not present

## 2020-02-12 DIAGNOSIS — Z79899 Other long term (current) drug therapy: Secondary | ICD-10-CM | POA: Diagnosis not present

## 2020-02-12 DIAGNOSIS — Z87891 Personal history of nicotine dependence: Secondary | ICD-10-CM | POA: Insufficient documentation

## 2020-02-12 DIAGNOSIS — Z7983 Long term (current) use of bisphosphonates: Secondary | ICD-10-CM | POA: Diagnosis not present

## 2020-02-12 DIAGNOSIS — I10 Essential (primary) hypertension: Secondary | ICD-10-CM | POA: Diagnosis not present

## 2020-02-12 DIAGNOSIS — K4190 Unilateral femoral hernia, without obstruction or gangrene, not specified as recurrent: Secondary | ICD-10-CM | POA: Diagnosis not present

## 2020-02-12 DIAGNOSIS — M48061 Spinal stenosis, lumbar region without neurogenic claudication: Secondary | ICD-10-CM | POA: Diagnosis not present

## 2020-02-12 HISTORY — PX: INCISIONAL HERNIA REPAIR: SHX193

## 2020-02-12 HISTORY — DX: Personal history of cervical dysplasia: Z87.410

## 2020-02-12 LAB — POCT I-STAT, CHEM 8
BUN: 18 mg/dL (ref 8–23)
Calcium, Ion: 1.13 mmol/L — ABNORMAL LOW (ref 1.15–1.40)
Chloride: 101 mmol/L (ref 98–111)
Creatinine, Ser: 0.5 mg/dL (ref 0.44–1.00)
Glucose, Bld: 86 mg/dL (ref 70–99)
HCT: 45 % (ref 36.0–46.0)
Hemoglobin: 15.3 g/dL — ABNORMAL HIGH (ref 12.0–15.0)
Potassium: 3.8 mmol/L (ref 3.5–5.1)
Sodium: 156 mmol/L — ABNORMAL HIGH (ref 135–145)
TCO2: 13 mmol/L — ABNORMAL LOW (ref 22–32)

## 2020-02-12 SURGERY — REPAIR, HERNIA, INCISIONAL, LAPAROSCOPIC
Anesthesia: General | Site: Abdomen | Laterality: Left

## 2020-02-12 MED ORDER — ONDANSETRON HCL 4 MG/2ML IJ SOLN: 4.0000 mg | Freq: Once | INTRAMUSCULAR | Status: DC | PRN

## 2020-02-12 MED ORDER — BUPIVACAINE LIPOSOME 1.3 % IJ SUSP: 20.0000 mL | Freq: Once | INTRAMUSCULAR | Status: DC

## 2020-02-12 MED ORDER — PROPOFOL 10 MG/ML IV BOLUS
INTRAVENOUS | Status: AC
  Filled 2020-02-12: qty 20

## 2020-02-12 MED ORDER — CEFAZOLIN SODIUM-DEXTROSE 2-4 GM/100ML-% IV SOLN
2.0000 g | INTRAVENOUS | Status: AC
  Administered 2020-02-12: 2 g via INTRAVENOUS

## 2020-02-12 MED ORDER — PHENYLEPHRINE 40 MCG/ML (10ML) SYRINGE FOR IV PUSH (FOR BLOOD PRESSURE SUPPORT)
PREFILLED_SYRINGE | INTRAVENOUS | Status: AC
  Filled 2020-02-12: qty 10

## 2020-02-12 MED ORDER — KETOROLAC TROMETHAMINE 15 MG/ML IJ SOLN
15.0000 mg | INTRAMUSCULAR | Status: AC
  Administered 2020-02-12: 15 mg via INTRAVENOUS

## 2020-02-12 MED ORDER — ROCURONIUM BROMIDE 10 MG/ML (PF) SYRINGE
PREFILLED_SYRINGE | INTRAVENOUS | Status: AC
  Filled 2020-02-12: qty 10

## 2020-02-12 MED ORDER — FENTANYL CITRATE (PF) 100 MCG/2ML IJ SOLN
INTRAMUSCULAR | Status: DC | PRN
  Administered 2020-02-12: 50 ug via INTRAVENOUS
  Administered 2020-02-12: 25 ug via INTRAVENOUS
  Administered 2020-02-12: 50 ug via INTRAVENOUS
  Administered 2020-02-12: 25 ug via INTRAVENOUS

## 2020-02-12 MED ORDER — CEFAZOLIN SODIUM-DEXTROSE 2-4 GM/100ML-% IV SOLN
INTRAVENOUS | Status: AC
  Filled 2020-02-12: qty 100

## 2020-02-12 MED ORDER — ONDANSETRON HCL 4 MG/2ML IJ SOLN
INTRAMUSCULAR | Status: DC | PRN
  Administered 2020-02-12: 4 mg via INTRAVENOUS

## 2020-02-12 MED ORDER — ACETAMINOPHEN 500 MG PO TABS
ORAL_TABLET | ORAL | Status: AC
  Filled 2020-02-12: qty 2

## 2020-02-12 MED ORDER — PROPOFOL 10 MG/ML IV BOLUS
INTRAVENOUS | Status: DC | PRN
  Administered 2020-02-12: 150 mg via INTRAVENOUS

## 2020-02-12 MED ORDER — IBUPROFEN 800 MG PO TABS: 800.0000 mg | ORAL_TABLET | Freq: Three times a day (TID) | ORAL | 0 refills | Status: AC | PRN

## 2020-02-12 MED ORDER — OXYCODONE HCL 5 MG/5ML PO SOLN: 5.0000 mg | Freq: Once | ORAL | Status: DC | PRN

## 2020-02-12 MED ORDER — LABETALOL HCL 5 MG/ML IV SOLN
INTRAVENOUS | Status: DC | PRN
  Administered 2020-02-12: 5 mg via INTRAVENOUS

## 2020-02-12 MED ORDER — SUGAMMADEX SODIUM 200 MG/2ML IV SOLN
INTRAVENOUS | Status: DC | PRN
  Administered 2020-02-12 (×2): 100 mg via INTRAVENOUS
  Administered 2020-02-12: 200 mg via INTRAVENOUS

## 2020-02-12 MED ORDER — DEXAMETHASONE SODIUM PHOSPHATE 10 MG/ML IJ SOLN
INTRAMUSCULAR | Status: DC | PRN
  Administered 2020-02-12: 10 mg via INTRAVENOUS

## 2020-02-12 MED ORDER — PHENYLEPHRINE 40 MCG/ML (10ML) SYRINGE FOR IV PUSH (FOR BLOOD PRESSURE SUPPORT)
PREFILLED_SYRINGE | INTRAVENOUS | Status: DC | PRN
  Administered 2020-02-12: 80 ug via INTRAVENOUS

## 2020-02-12 MED ORDER — KETOROLAC TROMETHAMINE 30 MG/ML IJ SOLN
INTRAMUSCULAR | Status: AC
  Filled 2020-02-12: qty 1

## 2020-02-12 MED ORDER — LABETALOL HCL 5 MG/ML IV SOLN
INTRAVENOUS | Status: AC
  Filled 2020-02-12: qty 4

## 2020-02-12 MED ORDER — BUPIVACAINE HCL 0.5 % IJ SOLN
INTRAMUSCULAR | Status: DC | PRN
  Administered 2020-02-12: 30 mL

## 2020-02-12 MED ORDER — LIDOCAINE 2% (20 MG/ML) 5 ML SYRINGE
INTRAMUSCULAR | Status: AC
  Filled 2020-02-12: qty 5

## 2020-02-12 MED ORDER — ROCURONIUM BROMIDE 10 MG/ML (PF) SYRINGE
PREFILLED_SYRINGE | INTRAVENOUS | Status: DC | PRN
  Administered 2020-02-12: 50 mg via INTRAVENOUS

## 2020-02-12 MED ORDER — MIDAZOLAM HCL 2 MG/2ML IJ SOLN
INTRAMUSCULAR | Status: AC
  Filled 2020-02-12: qty 2

## 2020-02-12 MED ORDER — BUPIVACAINE LIPOSOME 1.3 % IJ SUSP
INTRAMUSCULAR | Status: DC | PRN
  Administered 2020-02-12: 20 mL

## 2020-02-12 MED ORDER — MIDAZOLAM HCL 2 MG/2ML IJ SOLN
INTRAMUSCULAR | Status: DC | PRN
  Administered 2020-02-12: 1 mg via INTRAVENOUS

## 2020-02-12 MED ORDER — LIDOCAINE 2% (20 MG/ML) 5 ML SYRINGE
INTRAMUSCULAR | Status: DC | PRN
  Administered 2020-02-12: 80 mg via INTRAVENOUS

## 2020-02-12 MED ORDER — DEXAMETHASONE SODIUM PHOSPHATE 10 MG/ML IJ SOLN
INTRAMUSCULAR | Status: AC
  Filled 2020-02-12: qty 1

## 2020-02-12 MED ORDER — ENSURE PRE-SURGERY PO LIQD: 296.0000 mL | Freq: Once | ORAL | Status: DC

## 2020-02-12 MED ORDER — LIDOCAINE 2% (20 MG/ML) 5 ML SYRINGE
INTRAMUSCULAR | Status: DC | PRN
  Administered 2020-02-12: 1.5 mg/kg/h via INTRAVENOUS

## 2020-02-12 MED ORDER — CHLORHEXIDINE GLUCONATE CLOTH 2 % EX PADS: 6.0000 | MEDICATED_PAD | Freq: Once | CUTANEOUS | Status: DC

## 2020-02-12 MED ORDER — LIDOCAINE 2% (20 MG/ML) 5 ML SYRINGE
INTRAMUSCULAR | Status: AC
  Filled 2020-02-12: qty 10

## 2020-02-12 MED ORDER — OXYCODONE HCL 5 MG PO TABS: 5.0000 mg | ORAL_TABLET | Freq: Once | ORAL | Status: DC | PRN

## 2020-02-12 MED ORDER — HYDROCODONE-ACETAMINOPHEN 5-325 MG PO TABS: 1.0000 | ORAL_TABLET | Freq: Four times a day (QID) | ORAL | 0 refills | Status: AC | PRN

## 2020-02-12 MED ORDER — FENTANYL CITRATE (PF) 100 MCG/2ML IJ SOLN
INTRAMUSCULAR | Status: AC
  Filled 2020-02-12: qty 2

## 2020-02-12 MED ORDER — FENTANYL CITRATE (PF) 100 MCG/2ML IJ SOLN: 25.0000 ug | INTRAMUSCULAR | Status: DC | PRN

## 2020-02-12 MED ORDER — ONDANSETRON HCL 4 MG/2ML IJ SOLN
INTRAMUSCULAR | Status: AC
  Filled 2020-02-12: qty 2

## 2020-02-12 MED ORDER — AMISULPRIDE (ANTIEMETIC) 5 MG/2ML IV SOLN: 10.0000 mg | Freq: Once | INTRAVENOUS | Status: DC | PRN

## 2020-02-12 MED ORDER — ACETAMINOPHEN 500 MG PO TABS
1000.0000 mg | ORAL_TABLET | ORAL | Status: AC
  Administered 2020-02-12: 1000 mg via ORAL

## 2020-02-12 MED ORDER — LACTATED RINGERS IV SOLN: INTRAVENOUS | Status: DC

## 2020-02-12 SURGICAL SUPPLY — 47 items
ADH SKN CLS APL DERMABOND .7 (GAUZE/BANDAGES/DRESSINGS) ×1
APL PRP STRL LF DISP 70% ISPRP (MISCELLANEOUS) ×1
APPLIER CLIP 5 13 M/L LIGAMAX5 (MISCELLANEOUS)
APR CLP MED LRG 5 ANG JAW (MISCELLANEOUS)
BINDER ABDOMINAL 12 ML 46-62 (SOFTGOODS) IMPLANT
BLADE CLIPPER SENSICLIP SURGIC (BLADE) ×3 IMPLANT
CABLE HIGH FREQUENCY MONO STRZ (ELECTRODE) ×4 IMPLANT
CHLORAPREP W/TINT 26 (MISCELLANEOUS) ×3 IMPLANT
CLIP APPLIE 5 13 M/L LIGAMAX5 (MISCELLANEOUS) IMPLANT
COVER WAND RF STERILE (DRAPES) ×4 IMPLANT
DECANTER SPIKE VIAL GLASS SM (MISCELLANEOUS) ×1 IMPLANT
DERMABOND ADVANCED (GAUZE/BANDAGES/DRESSINGS) ×2
DERMABOND ADVANCED .7 DNX12 (GAUZE/BANDAGES/DRESSINGS) ×2 IMPLANT
DEVICE SECURE STRAP 25 ABSORB (INSTRUMENTS) ×3 IMPLANT
DRAIN CHANNEL 19F RND (DRAIN) IMPLANT
DRSG COVADERM PLUS 2X2 (GAUZE/BANDAGES/DRESSINGS) IMPLANT
ELECT REM PT RETURN 9FT ADLT (ELECTROSURGICAL) ×3
ELECTRODE REM PT RTRN 9FT ADLT (ELECTROSURGICAL) ×2 IMPLANT
EVACUATOR SILICONE 100CC (DRAIN) IMPLANT
GLOVE BIOGEL PI IND STRL 7.0 (GLOVE) ×2 IMPLANT
GLOVE BIOGEL PI INDICATOR 7.0 (GLOVE) ×4
GLOVE SURG SS PI 7.0 STRL IVOR (GLOVE) ×6 IMPLANT
GOWN STRL REUS W/TWL LRG LVL3 (GOWN DISPOSABLE) ×6 IMPLANT
GRASPER SUT TROCAR 14GX15 (MISCELLANEOUS) ×3 IMPLANT
IRRIG SUCT STRYKERFLOW 2 WTIP (MISCELLANEOUS)
IRRIGATION SUCT STRKRFLW 2 WTP (MISCELLANEOUS) ×1 IMPLANT
IV NS IRRIG 3000ML ARTHROMATIC (IV SOLUTION) ×2 IMPLANT
KIT TURNOVER CYSTO (KITS) ×3 IMPLANT
MESH 3DMAX 4X6 LT LRG (Mesh General) ×3 IMPLANT
PACK BASIN DAY SURGERY FS (CUSTOM PROCEDURE TRAY) ×4 IMPLANT
SCISSORS LAP 5X35 DISP (ENDOMECHANICALS) ×3 IMPLANT
SET SUCTION IRRIG HYDROSURG (IRRIGATION / IRRIGATOR) ×2 IMPLANT
SET TUBE SMOKE EVAC HIGH FLOW (TUBING) ×4 IMPLANT
SHEARS HARMONIC ACE PLUS 36CM (ENDOMECHANICALS) ×3 IMPLANT
SUT ETHILON 2 0 PS N (SUTURE) IMPLANT
SUT MNCRL AB 4-0 PS2 18 (SUTURE) ×4 IMPLANT
SUT NOVA NAB GS-21 0 18 T12 DT (SUTURE) ×2 IMPLANT
SUT PDS AB 0 CT 36 (SUTURE) ×2 IMPLANT
SUT VIC AB 2-0 SH 27 (SUTURE)
SUT VIC AB 2-0 SH 27XBRD (SUTURE) IMPLANT
SUT VICRYL 0 UR6 27IN ABS (SUTURE) IMPLANT
SUT VLOC BARB 180 ABS3/0GR12 (SUTURE) ×3
SUTURE VLOC BRB 180 ABS3/0GR12 (SUTURE) IMPLANT
TOWEL OR 17X26 10 PK STRL BLUE (TOWEL DISPOSABLE) ×4 IMPLANT
TRAY LAPAROSCOPIC (CUSTOM PROCEDURE TRAY) ×3 IMPLANT
TROCAR BLADELESS OPT 12M 100M (ENDOMECHANICALS) ×4 IMPLANT
TROCAR BLADELESS OPT 5 100 (ENDOMECHANICALS) ×8 IMPLANT

## 2020-02-12 NOTE — Transfer of Care (Signed)
   Last Vitals:  Vitals Value Taken Time  BP 125/87 02/12/20 1522  Temp 36.4 C 02/12/20 1522  Pulse 80 02/12/20 1530  Resp 15 02/12/20 1530  SpO2 100 % 02/12/20 1530  Vitals shown include unvalidated device data.  Last Pain:  Vitals:   02/12/20 1522  TempSrc:   PainSc: 0-No pain      Patients Stated Pain Goal: 3 (02/12/20 1148)  Immediate Anesthesia Transfer of Care Note  Patient: Karen Hancock  Procedure(s) Performed: Procedure(s) (LRB): LAPAROSCOPIC LEFT FEMORAL HERNIA REPAIR WITH MESH (Left)  Patient Location: PACU  Anesthesia Type: General  Level of Consciousness: awake, alert  and oriented  Airway & Oxygen Therapy: Patient Spontanous Breathing and Patient connected to nasal cannula oxygen  Post-op Assessment: Report given to PACU RN and Post -op Vital signs reviewed and stable  Post vital signs: Reviewed and stable  Complications: No apparent anesthesia complications

## 2020-02-12 NOTE — Discharge Instructions (Signed)
  Post Anesthesia Home Care Instructions  Activity: Get plenty of rest for the remainder of the day. A responsible adult should stay with you for 24 hours following the procedure.  For the next 24 hours, DO NOT: -Drive a car -Paediatric nurse -Drink alcoholic beverages -Take any medication unless instructed by your physician -Make any legal decisions or sign important papers.  Meals: Start with liquid foods such as gelatin or soup. Progress to regular foods as tolerated. Avoid greasy, spicy, heavy foods. If nausea and/or vomiting occur, drink only clear liquids until the nausea and/or vomiting subsides. Call your physician if vomiting continues.  Special Instructions/Symptoms: Your throat may feel dry or sore from the anesthesia or the breathing tube placed in your throat during surgery. If this causes discomfort, gargle with warm salt water. The discomfort should disappear within 24 hours.  If you had a scopolamine patch placed behind your ear for the management of post- operative nausea and/or vomiting:  1. The medication in the patch is effective for 72 hours, after which it should be removed.  Wrap patch in a tissue and discard in the trash. Wash hands thoroughly with soap and water. 2. You may remove the patch earlier than 72 hours if you experience unpleasant side effects which may include dry mouth, dizziness or visual disturbances. 3. Avoid touching the patch. Wash your hands with soap and water after contact with the patch.   Information for Discharge Teaching: EXPAREL (bupivacaine liposome injectable suspension)   Your surgeon gave you EXPAREL(bupivacaine) in your surgical incision to help control your pain after surgery.   EXPAREL is a local anesthetic that provides pain relief by numbing the tissue around the surgical site.  EXPAREL is designed to release pain medication over time and can control pain for up to 72 hours.  Depending on how you respond to EXPAREL, you may  require less pain medication during your recovery.  Possible side effects:  Temporary loss of sensation or ability to move in the area where bupivacaine was injected.  Nausea, vomiting, constipation  Rarely, numbness and tingling in your mouth or lips, lightheadedness, or anxiety may occur.  Call your doctor right away if you think you may be experiencing any of these sensations, or if you have other questions regarding possible side effects.  Follow all other discharge instructions given to you by your surgeon or nurse. Eat a healthy diet and drink plenty of water or other fluids.  If you return to the hospital for any reason within 96 hours following the administration of EXPAREL, please inform your health care providers.

## 2020-02-12 NOTE — H&P (Signed)
Karen Hancock is an 69 y.o. female.   Chief Complaint: hernia HPI: 69 yo female with left sided pain and bulge. On exam she was found to have a hernia and presents for repair.  Past Medical History:  Diagnosis Date  . ADD (attention deficit disorder)   . Anxiety   . Family history of adverse reaction to anesthesia    mother --- PONV   . History of cervical dysplasia   . Hypertension    followed by pcp  . Osteopenia     Past Surgical History:  Procedure Laterality Date  . APPENDECTOMY  age 25  . BREAST EXCISIONAL BIOPSY    . BREAST EXCISIONAL BIOPSY Left 10-09-2000 @ [facility]   per pt benign  . GYNECOLOGIC CRYOSURGERY  1970s  . HERNIA REPAIR  1980s  . LUMBAR LAMINECTOMY/DECOMPRESSION MICRODISCECTOMY Bilateral 07/16/2019   Procedure: Laminectomy and Foraminotomy - Lumbar Three-Lumbar Four - bilateral - Lumbar Four-Lumbar Five - right;  Surgeon: Kary Kos, MD;  Location: [facility];  Service: Neurosurgery;  Laterality: Bilateral;  Laminectomy and Foraminotomy - Lumbar Three-Lumbar Four - bilateral - Lumbar Four-Lumbar Five - right    Family History  Problem Relation Age of Onset  . Hypertension Mother   . Diabetes Mother   . Hypertension Father   . Diabetes Sister   . Breast cancer Maternal Aunt        Great aunt-Age 68  . Uterine cancer Paternal Aunt    Social History:  reports that she quit smoking about 37 years ago. Her smoking use included cigarettes. She quit after 13.00 years of use. She has never used smokeless tobacco. She reports current alcohol use. She reports that she does not use drugs.  Allergies: No Known Allergies  Medications Prior to Admission  Medication Sig Dispense Refill  . alendronate (FOSAMAX) 70 MG tablet Take 70 mg by mouth once a week. Take with a full glass of water on an empty stomach.    . ARIPiprazole (ABILIFY) 15 MG tablet Take 7.5 mg by mouth daily.    Marland Kitchen atorvastatin (LIPITOR) 10 MG tablet Take 10 mg by mouth daily.    . Biotin 10000 MCG TABS Take  10,000 mcg by mouth daily.    . Cholecalciferol (VITAMIN D) 125 MCG (5000 UT) CAPS Take 5,000 Units by mouth daily.     . CYMBALTA 30 MG capsule Take 90 mg by mouth every morning.    Marland Kitchen losartan (COZAAR) 100 MG tablet Take 100 mg by mouth daily.     . Phenazopyridine HCl (AZO TABS PO) Take 1 tablet by mouth daily.    . Probiotic Product (PROBIOTIC-10 PO) Take 1 capsule by mouth daily.    . traZODone (DESYREL) 100 MG tablet Take 100 mg by mouth at bedtime.    . TURMERIC PO Take 1 tablet by mouth daily.      Results for orders placed or performed during the hospital encounter of 02/12/20 (from the past 48 hour(s))  I-STAT, chem 8     Status: Abnormal   Collection Time: 02/12/20 11:49 AM  Result Value Ref Range   Sodium 156 (H) 135 - 145 mmol/L   Potassium 3.8 3.5 - 5.1 mmol/L   Chloride 101 98 - 111 mmol/L   BUN 18 8 - 23 mg/dL   Creatinine, Ser 0.50 0.44 - 1.00 mg/dL   Glucose, Bld 86 70 - 99 mg/dL    Comment: Glucose reference range applies only to samples taken after fasting for at least 8  hours.   Calcium, Ion 1.13 (L) 1.15 - 1.40 mmol/L   TCO2 13 (L) 22 - 32 mmol/L   Hemoglobin 15.3 (H) 12.0 - 15.0 g/dL   HCT 45.0 36 - 46 %   Comment NOTIFIED PHYSICIAN    No results found.  Review of Systems  Constitutional: Negative for chills and fever.  HENT: Negative for hearing loss.   Respiratory: Negative for cough.   Cardiovascular: Negative for chest pain and palpitations.  Gastrointestinal: Negative for abdominal pain, nausea and vomiting.  Genitourinary: Negative for dysuria and urgency.  Musculoskeletal: Negative for myalgias and neck pain.  Skin: Negative for rash.  Neurological: Negative for dizziness and headaches.  Hematological: Does not bruise/bleed easily.  Psychiatric/Behavioral: Negative for suicidal ideas.    Blood pressure (!) 125/94, pulse (!) 109, temperature 98 F (36.7 C), temperature source Oral, resp. rate 14, height 5\' 3"  (1.6 m), weight 60.1 kg, SpO2 97  %. Physical Exam Vitals reviewed.  Constitutional:      Appearance: She is well-developed.  HENT:     Head: Normocephalic and atraumatic.  Eyes:     Conjunctiva/sclera: Conjunctivae normal.     Pupils: Pupils are equal, round, and reactive to light.  Cardiovascular:     Rate and Rhythm: Normal rate and regular rhythm.  Pulmonary:     Effort: Pulmonary effort is normal.     Breath sounds: Normal breath sounds.  Abdominal:     General: Bowel sounds are normal. There is no distension.     Palpations: Abdomen is soft.     Tenderness: There is no abdominal tenderness.     Comments: Left low inguinal area hernia  Musculoskeletal:        General: Normal range of motion.     Cervical back: Normal range of motion and neck supple.  Skin:    General: Skin is warm and dry.  Neurological:     Mental Status: She is alert and oriented to person, place, and time.  Psychiatric:        Behavior: Behavior normal.      Assessment/Plan 69 yo female with left femoral hernia -lap left femoral hernia repair with mesh  Mickeal Skinner, MD 02/12/2020, 1:45 PM

## 2020-02-12 NOTE — Anesthesia Postprocedure Evaluation (Signed)
Anesthesia Post Note  Patient: Karen Hancock  Procedure(s) Performed: LAPAROSCOPIC LEFT FEMORAL HERNIA REPAIR WITH MESH (Left Abdomen)     Patient location during evaluation: PACU Anesthesia Type: General Level of consciousness: awake Pain management: pain level controlled Vital Signs Assessment: post-procedure vital signs reviewed and stable Respiratory status: spontaneous breathing Cardiovascular status: stable Postop Assessment: no apparent nausea or vomiting Anesthetic complications: no   No complications documented.  Last Vitals:  Vitals:   02/12/20 1530 02/12/20 1545  BP: 123/82 129/80  Pulse: 80 80  Resp: 15 14  Temp:    SpO2: 100% 100%    Last Pain:  Vitals:   02/12/20 1545  TempSrc:   PainSc: 0-No pain                 Makinsey Pepitone

## 2020-02-12 NOTE — Op Note (Signed)
Preop diagnosis: left femoral hernia  Postop diagnosis: left femoral hernia  Procedure: laparoscopic Left femoral hernia repair with mesh  Surgeon: Gurney Maxin, M.D.  Asst: none  Anesthesia: Gen.   Indications for procedure: Karen Hancock is a 69 y.o. female with symptoms of pain and enlarging Left femoral hernia(s). After discussing risks, alternatives and benefits she decided on laparoscopic repair and was brought to day surgery for repair.  Description of procedure: The patient was brought into the operative suite, placed supine. Anesthesia was administered with endotracheal tube. Patient was strapped in place. The patient was prepped and draped in the usual sterile fashion.  Next, a left subcostal incision was made. A 71mm trocar was used to gain access to the peritoneal cavity by optical entry technique. Pneumoperitoneum was applied with a high flow and low pressure. The laparoscope was reinserted to confirm position. There were adhesions of the omentum to the left and right groin and upper right abdomen. 1 12 mm trocar was placed to the right of the umbilicus and 1 5 mm trocar was placed into the right lower quadrant. Adhesions of the lower abdomen was taken down with harmonic scalpel.  Bilateral TAP blocks were placed with Marcaine/Exparel mix.  A left peritoneal flap was created with harmonic scalpel. Blunt dissection was used to create the flap and was able to identify the iliac vessels and inferior epigastric vessels. In doing this dissection there was a tear near the hernia because of the dense adherence at the hernia location. The hernia was dissected free of the surrounding contents. It was firm necrotic fat within the sac.  A large left 3D max mesh was inserted and tacked medially to the lacunar ligament. 2 tacks were placed in the superior medial aspect as well. The mesh was positioned flat and directly up against the direct and indirect areas. The peritoneum was reapposed to  the abdominal wall. The peritoneal tear was closed with v-loc suture in running fashion.  The CO2 was evacuated while watching to ensure the mesh did not migrate.. The anterior rectus fascia was closed with 0 vicryl in interrupted sutures and all skin incisions were closed with 4-0 monocryl subcu stitch. The patient awoke from anesthesia and was brought to PACU in stable condition.  Findings: left femora hernia  Specimen: none  Blood loss: 20 ml  Local anesthesia: 50 ml Exparel:Marcaine mix  Complications: none  Implant: large left Bard 3D max mesh  Images:       Gurney Maxin, M.D. General, Bariatric, & Minimally Invasive Surgery [facility], [place] 2:58 PM 02/12/2020

## 2020-02-12 NOTE — Anesthesia Preprocedure Evaluation (Signed)
Anesthesia Evaluation  Patient identified by MRN, date of birth, ID band Patient awake    Reviewed: Allergy & Precautions, NPO status , Patient's Chart, lab work & pertinent test results  History of Anesthesia Complications (+) Family history of anesthesia reaction  Airway Mallampati: II  TM Distance: >3 FB Neck ROM: Full    Dental  (+) Dental Advisory Given   Pulmonary former smoker,    breath sounds clear to auscultation       Cardiovascular Exercise Tolerance: Good hypertension, Pt. on medications  Rhythm:Regular Rate:Normal     Neuro/Psych PSYCHIATRIC DISORDERS Anxiety negative neurological ROS     GI/Hepatic negative GI ROS, Neg liver ROS,   Endo/Other  osteopenia  Renal/GU negative Renal ROS  negative genitourinary   Musculoskeletal negative musculoskeletal ROS (+)   Abdominal   Peds negative pediatric ROS (+)  Hematology negative hematology ROS (+)   Anesthesia Other Findings Left femoral hernia  Reproductive/Obstetrics negative OB ROS                             Lab Results  Component Value Date   WBC 16.0 (H) 07/09/2019   HGB 13.6 07/09/2019   HCT 42.5 07/09/2019   MCV 97.5 07/09/2019   PLT 245 07/09/2019   Lab Results  Component Value Date   CREATININE 0.62 07/09/2019   BUN 13 07/09/2019   NA 137 07/09/2019   K 4.0 07/09/2019   CL 104 07/09/2019   CO2 24 07/09/2019    Anesthesia Physical  Anesthesia Plan  ASA: II  Anesthesia Plan: General   Post-op Pain Management:    Induction: Intravenous  PONV Risk Score and Plan: 3 and Dexamethasone, Ondansetron and Treatment may vary due to age or medical condition  Airway Management Planned: Oral ETT  Additional Equipment: None  Intra-op Plan:   Post-operative Plan: Extubation in OR  Informed Consent: I have reviewed the patients History and Physical, chart, labs and discussed the procedure including the  risks, benefits and alternatives for the proposed anesthesia with the patient or authorized representative who has indicated his/her understanding and acceptance.     Dental advisory given  Plan Discussed with: CRNA  Anesthesia Plan Comments: ( )        Anesthesia Quick Evaluation

## 2020-02-12 NOTE — Anesthesia Procedure Notes (Addendum)
Procedure Name: Intubation Date/Time: 02/12/2020 1:57 PM Performed by: Mechele Claude, CRNA Pre-anesthesia Checklist: Patient identified, Emergency Drugs available, Suction available and Patient being monitored Patient Re-evaluated:Patient Re-evaluated prior to induction Oxygen Delivery Method: Circle system utilized Preoxygenation: Pre-oxygenation with 100% oxygen Induction Type: IV induction Ventilation: Mask ventilation without difficulty Laryngoscope Size: Mac and 3 Grade View: Grade II Tube type: Oral Tube size: 7.0 mm Number of attempts: 1 Airway Equipment and Method: Stylet and Oral airway Placement Confirmation: ETT inserted through vocal cords under direct vision,  positive ETCO2 and breath sounds checked- equal and bilateral Secured at: 22 cm Tube secured with: Tape Dental Injury: Teeth and Oropharynx as per pre-operative assessment

## 2020-02-13 ENCOUNTER — Encounter (HOSPITAL_BASED_OUTPATIENT_CLINIC_OR_DEPARTMENT_OTHER): Payer: Self-pay | Admitting: General Surgery

## 2020-02-26 DIAGNOSIS — L821 Other seborrheic keratosis: Secondary | ICD-10-CM | POA: Diagnosis not present

## 2020-02-26 DIAGNOSIS — L570 Actinic keratosis: Secondary | ICD-10-CM | POA: Diagnosis not present

## 2020-02-26 DIAGNOSIS — Z85828 Personal history of other malignant neoplasm of skin: Secondary | ICD-10-CM | POA: Diagnosis not present

## 2020-03-08 ENCOUNTER — Ambulatory Visit
Admission: RE | Admit: 2020-03-08 | Discharge: 2020-03-08 | Disposition: A | Discharge status: Home / Self Care | Payer: Medicare Other | Source: Ambulatory Visit | Attending: Family Medicine | Admitting: Family Medicine

## 2020-03-08 ENCOUNTER — Other Ambulatory Visit: Payer: Self-pay

## 2020-03-08 DIAGNOSIS — M810 Age-related osteoporosis without current pathological fracture: Secondary | ICD-10-CM | POA: Diagnosis not present

## 2020-03-08 DIAGNOSIS — Z780 Asymptomatic menopausal state: Secondary | ICD-10-CM | POA: Diagnosis not present

## 2020-03-08 DIAGNOSIS — M85851 Other specified disorders of bone density and structure, right thigh: Secondary | ICD-10-CM | POA: Diagnosis not present

## 2020-04-21 DIAGNOSIS — F411 Generalized anxiety disorder: Secondary | ICD-10-CM | POA: Diagnosis not present

## 2020-05-31 DIAGNOSIS — H04123 Dry eye syndrome of bilateral lacrimal glands: Secondary | ICD-10-CM | POA: Diagnosis not present

## 2020-06-04 IMAGING — CR DG LUMBAR SPINE 2-3V
2 series · 2 of 2 positions shown · non-contrast
Comparison: Radiographs 12/25/2018. Images only from outside MRI
06/05/2019 without report

CLINICAL DATA: L3-4 and L4-5 laminectomy and foraminotomy.

EXAM:
LUMBAR SPINE - 2-3 VIEW

[lateral (1 of 2)]
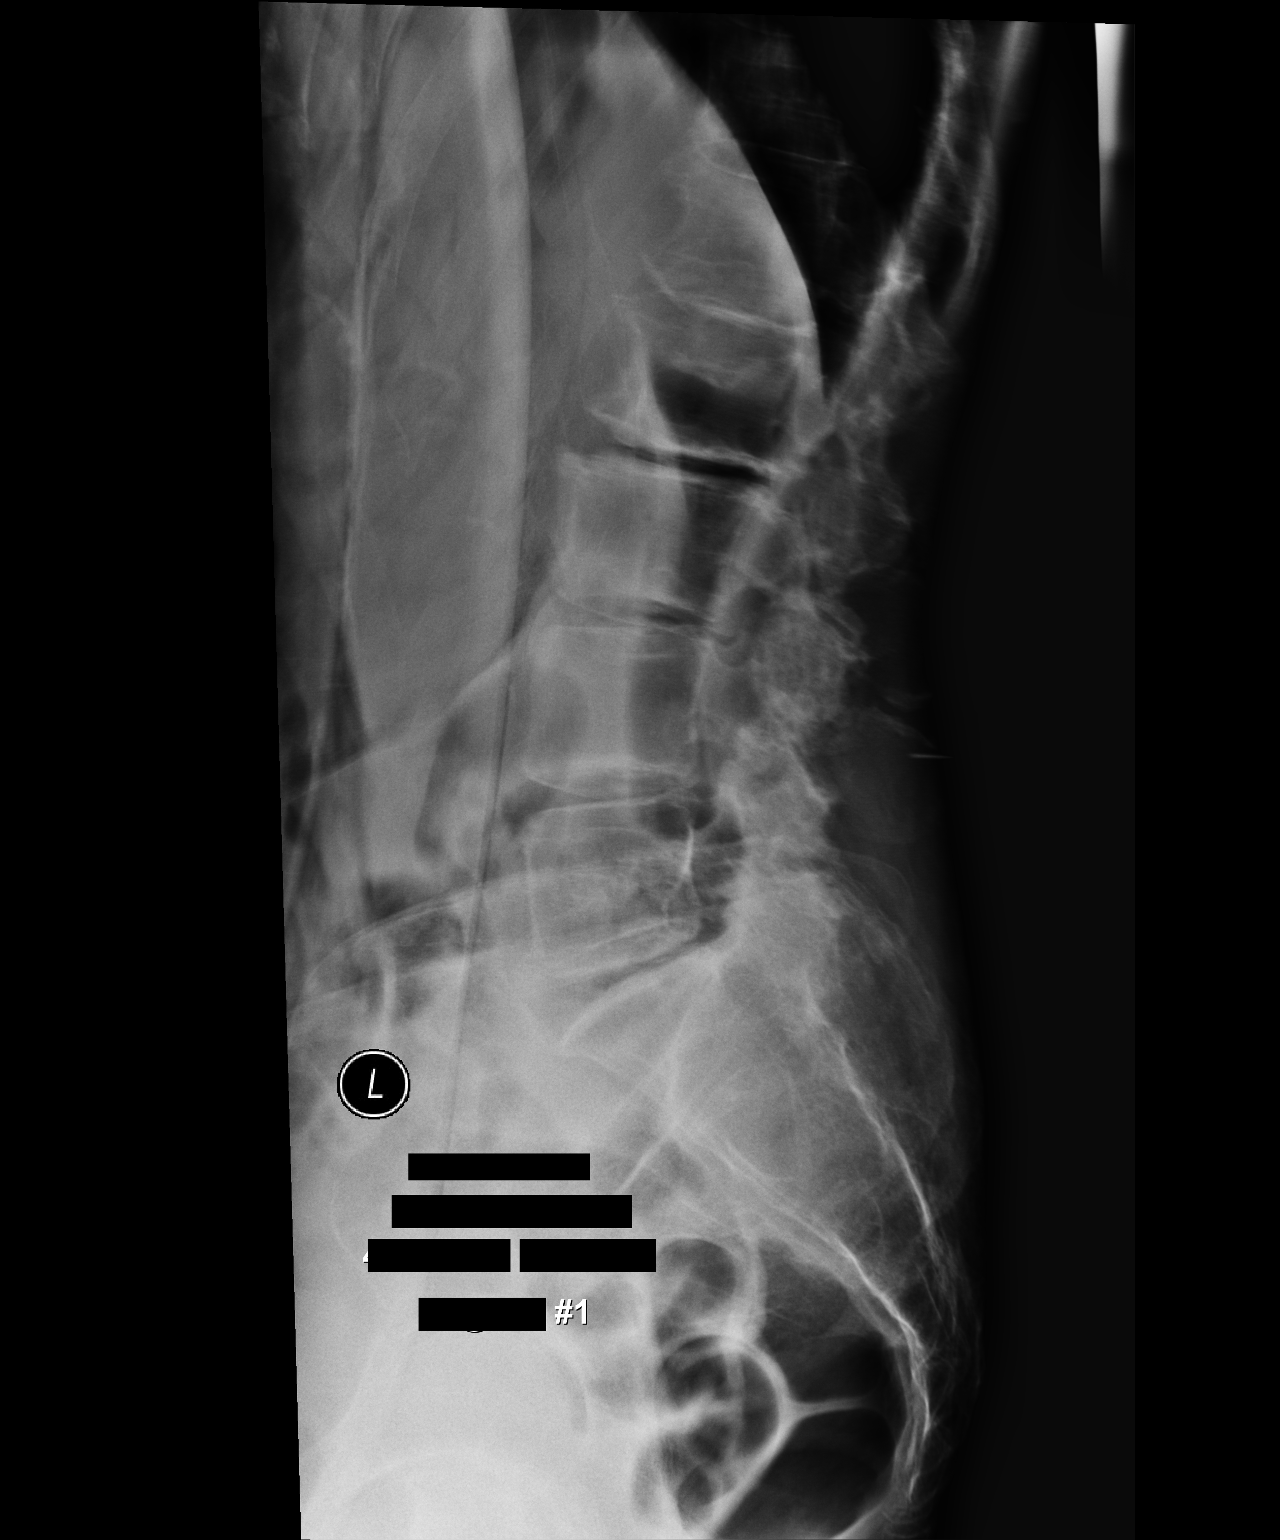

[lateral (2 of 2)]
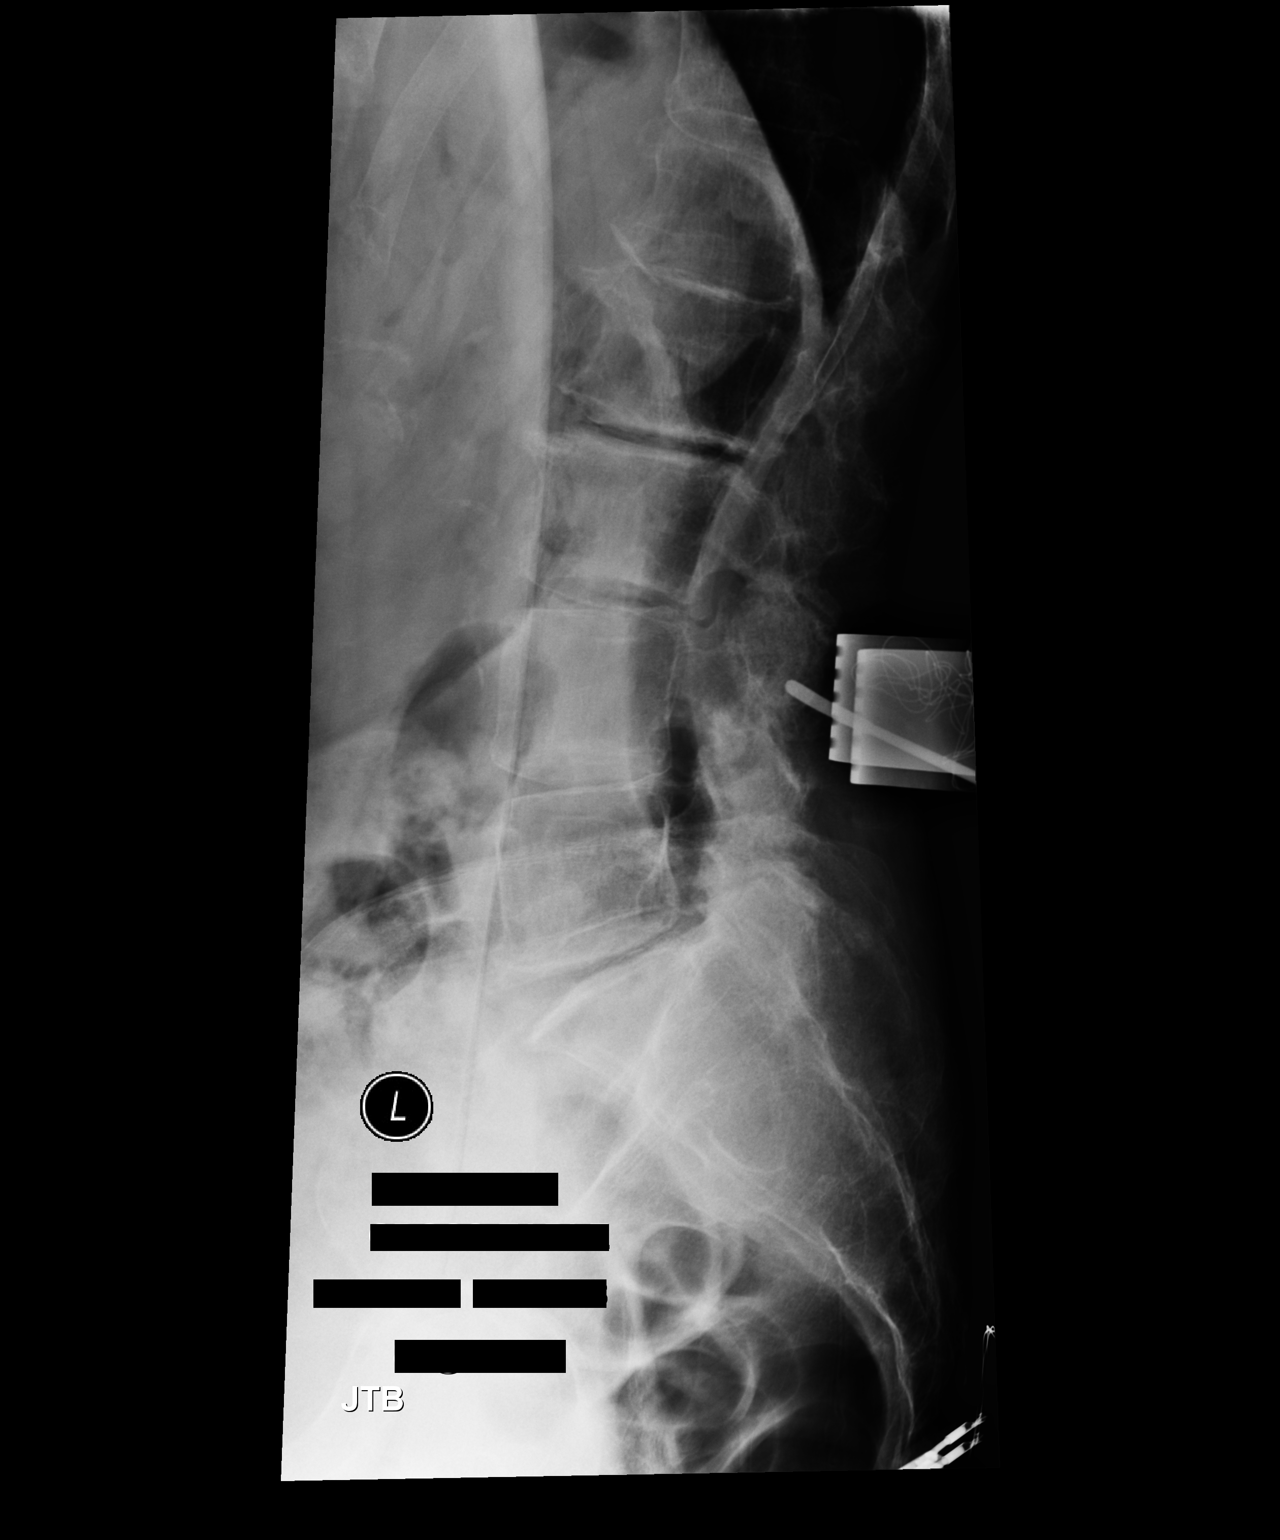

[2 of 2 positions shown; findings below may reference images not displayed]

FINDINGS: Five lumbar type vertebral bodies are assumed. Two cross-table
lateral views are submitted. The 1st image at 1827 hours
demonstrates a posterior localizing needle overlying the L4 spinous
process.

The 2nd image at 1218 hours demonstrates skin spreaders and a
surgical instrument posteriorly at L4. Multilevel spondylosis
appears grossly unchanged.
IMPRESSION: Intraoperative localization as described.

## 2020-07-19 DIAGNOSIS — F411 Generalized anxiety disorder: Secondary | ICD-10-CM | POA: Diagnosis not present

## 2020-07-19 DIAGNOSIS — H00014 Hordeolum externum left upper eyelid: Secondary | ICD-10-CM | POA: Diagnosis not present

## 2020-08-03 DIAGNOSIS — U071 COVID-19: Secondary | ICD-10-CM | POA: Diagnosis not present

## 2020-08-04 DIAGNOSIS — Z20822 Contact with and (suspected) exposure to covid-19: Secondary | ICD-10-CM | POA: Diagnosis not present

## 2020-08-11 DIAGNOSIS — Z03818 Encounter for observation for suspected exposure to other biological agents ruled out: Secondary | ICD-10-CM | POA: Diagnosis not present

## 2020-08-11 DIAGNOSIS — Z20822 Contact with and (suspected) exposure to covid-19: Secondary | ICD-10-CM | POA: Diagnosis not present

## 2020-08-24 ENCOUNTER — Other Ambulatory Visit: Payer: Self-pay | Admitting: Family Medicine

## 2020-08-24 DIAGNOSIS — Z1231 Encounter for screening mammogram for malignant neoplasm of breast: Secondary | ICD-10-CM

## 2020-09-02 DIAGNOSIS — F325 Major depressive disorder, single episode, in full remission: Secondary | ICD-10-CM | POA: Diagnosis not present

## 2020-09-02 DIAGNOSIS — E785 Hyperlipidemia, unspecified: Secondary | ICD-10-CM | POA: Diagnosis not present

## 2020-09-02 DIAGNOSIS — I10 Essential (primary) hypertension: Secondary | ICD-10-CM | POA: Diagnosis not present

## 2020-09-02 DIAGNOSIS — M810 Age-related osteoporosis without current pathological fracture: Secondary | ICD-10-CM | POA: Diagnosis not present

## 2020-10-20 ENCOUNTER — Other Ambulatory Visit: Payer: Self-pay

## 2020-10-20 ENCOUNTER — Ambulatory Visit
Admission: RE | Admit: 2020-10-20 | Discharge: 2020-10-20 | Disposition: A | Discharge status: Home / Self Care | Payer: Medicare Other | Source: Ambulatory Visit | Attending: Family Medicine | Admitting: Family Medicine

## 2020-10-20 DIAGNOSIS — Z1231 Encounter for screening mammogram for malignant neoplasm of breast: Secondary | ICD-10-CM

## 2020-11-17 DIAGNOSIS — F411 Generalized anxiety disorder: Secondary | ICD-10-CM | POA: Diagnosis not present

## 2020-11-23 DIAGNOSIS — E785 Hyperlipidemia, unspecified: Secondary | ICD-10-CM | POA: Diagnosis not present

## 2020-11-23 DIAGNOSIS — I10 Essential (primary) hypertension: Secondary | ICD-10-CM | POA: Diagnosis not present

## 2020-11-23 DIAGNOSIS — M810 Age-related osteoporosis without current pathological fracture: Secondary | ICD-10-CM | POA: Diagnosis not present

## 2020-11-23 DIAGNOSIS — F325 Major depressive disorder, single episode, in full remission: Secondary | ICD-10-CM | POA: Diagnosis not present

## 2020-12-07 DIAGNOSIS — K635 Polyp of colon: Secondary | ICD-10-CM | POA: Diagnosis not present

## 2020-12-07 DIAGNOSIS — Z87891 Personal history of nicotine dependence: Secondary | ICD-10-CM | POA: Diagnosis not present

## 2020-12-07 DIAGNOSIS — E559 Vitamin D deficiency, unspecified: Secondary | ICD-10-CM | POA: Diagnosis not present

## 2020-12-07 DIAGNOSIS — E785 Hyperlipidemia, unspecified: Secondary | ICD-10-CM | POA: Diagnosis not present

## 2020-12-07 DIAGNOSIS — I10 Essential (primary) hypertension: Secondary | ICD-10-CM | POA: Diagnosis not present

## 2020-12-07 DIAGNOSIS — M810 Age-related osteoporosis without current pathological fracture: Secondary | ICD-10-CM | POA: Diagnosis not present

## 2020-12-07 DIAGNOSIS — Z23 Encounter for immunization: Secondary | ICD-10-CM | POA: Diagnosis not present

## 2020-12-09 DIAGNOSIS — Z0000 Encounter for general adult medical examination without abnormal findings: Secondary | ICD-10-CM | POA: Diagnosis not present

## 2020-12-09 DIAGNOSIS — Z1389 Encounter for screening for other disorder: Secondary | ICD-10-CM | POA: Diagnosis not present

## 2020-12-13 DIAGNOSIS — Z23 Encounter for immunization: Secondary | ICD-10-CM | POA: Diagnosis not present

## 2021-01-17 DIAGNOSIS — H04123 Dry eye syndrome of bilateral lacrimal glands: Secondary | ICD-10-CM | POA: Diagnosis not present

## 2021-01-17 DIAGNOSIS — H524 Presbyopia: Secondary | ICD-10-CM | POA: Diagnosis not present

## 2021-01-17 DIAGNOSIS — H2513 Age-related nuclear cataract, bilateral: Secondary | ICD-10-CM | POA: Diagnosis not present

## 2021-01-19 DIAGNOSIS — L821 Other seborrheic keratosis: Secondary | ICD-10-CM | POA: Diagnosis not present

## 2021-01-19 DIAGNOSIS — L814 Other melanin hyperpigmentation: Secondary | ICD-10-CM | POA: Diagnosis not present

## 2021-01-19 DIAGNOSIS — D1801 Hemangioma of skin and subcutaneous tissue: Secondary | ICD-10-CM | POA: Diagnosis not present

## 2021-01-19 DIAGNOSIS — D2272 Melanocytic nevi of left lower limb, including hip: Secondary | ICD-10-CM | POA: Diagnosis not present

## 2021-01-19 DIAGNOSIS — D225 Melanocytic nevi of trunk: Secondary | ICD-10-CM | POA: Diagnosis not present

## 2021-01-19 DIAGNOSIS — D485 Neoplasm of uncertain behavior of skin: Secondary | ICD-10-CM | POA: Diagnosis not present

## 2021-01-19 DIAGNOSIS — Z85828 Personal history of other malignant neoplasm of skin: Secondary | ICD-10-CM | POA: Diagnosis not present

## 2021-01-19 DIAGNOSIS — L570 Actinic keratosis: Secondary | ICD-10-CM | POA: Diagnosis not present

## 2021-02-15 DIAGNOSIS — M205X2 Other deformities of toe(s) (acquired), left foot: Secondary | ICD-10-CM | POA: Diagnosis not present

## 2021-02-15 DIAGNOSIS — M205X1 Other deformities of toe(s) (acquired), right foot: Secondary | ICD-10-CM | POA: Diagnosis not present

## 2021-02-15 DIAGNOSIS — M2041 Other hammer toe(s) (acquired), right foot: Secondary | ICD-10-CM | POA: Diagnosis not present

## 2021-02-23 DIAGNOSIS — F411 Generalized anxiety disorder: Secondary | ICD-10-CM | POA: Diagnosis not present

## 2021-03-29 DIAGNOSIS — M898X9 Other specified disorders of bone, unspecified site: Secondary | ICD-10-CM | POA: Diagnosis not present

## 2021-03-29 DIAGNOSIS — M2041 Other hammer toe(s) (acquired), right foot: Secondary | ICD-10-CM | POA: Diagnosis not present

## 2021-04-27 DIAGNOSIS — M898X9 Other specified disorders of bone, unspecified site: Secondary | ICD-10-CM | POA: Diagnosis not present

## 2021-04-27 DIAGNOSIS — M2041 Other hammer toe(s) (acquired), right foot: Secondary | ICD-10-CM | POA: Diagnosis not present

## 2021-04-29 DIAGNOSIS — Z01818 Encounter for other preprocedural examination: Secondary | ICD-10-CM | POA: Diagnosis not present

## 2021-05-05 DIAGNOSIS — N390 Urinary tract infection, site not specified: Secondary | ICD-10-CM | POA: Diagnosis not present

## 2021-05-13 DIAGNOSIS — M2041 Other hammer toe(s) (acquired), right foot: Secondary | ICD-10-CM | POA: Diagnosis not present

## 2021-05-13 DIAGNOSIS — M898X9 Other specified disorders of bone, unspecified site: Secondary | ICD-10-CM | POA: Diagnosis not present

## 2021-05-13 DIAGNOSIS — M25774 Osteophyte, right foot: Secondary | ICD-10-CM | POA: Diagnosis not present

## 2021-05-17 DIAGNOSIS — M2041 Other hammer toe(s) (acquired), right foot: Secondary | ICD-10-CM | POA: Diagnosis not present

## 2021-05-18 DIAGNOSIS — F411 Generalized anxiety disorder: Secondary | ICD-10-CM | POA: Diagnosis not present

## 2021-05-26 DIAGNOSIS — M2041 Other hammer toe(s) (acquired), right foot: Secondary | ICD-10-CM | POA: Diagnosis not present

## 2021-06-28 DIAGNOSIS — N3020 Other chronic cystitis without hematuria: Secondary | ICD-10-CM | POA: Diagnosis not present

## 2021-08-10 DIAGNOSIS — F411 Generalized anxiety disorder: Secondary | ICD-10-CM | POA: Diagnosis not present

## 2021-09-07 ENCOUNTER — Other Ambulatory Visit: Payer: Self-pay | Admitting: Family Medicine

## 2021-09-07 DIAGNOSIS — Z1231 Encounter for screening mammogram for malignant neoplasm of breast: Secondary | ICD-10-CM

## 2021-10-18 DIAGNOSIS — H04123 Dry eye syndrome of bilateral lacrimal glands: Secondary | ICD-10-CM | POA: Diagnosis not present

## 2021-10-21 ENCOUNTER — Ambulatory Visit: Payer: Medicare Other

## 2021-11-03 DIAGNOSIS — F411 Generalized anxiety disorder: Secondary | ICD-10-CM | POA: Diagnosis not present

## 2021-11-09 ENCOUNTER — Ambulatory Visit
Admission: RE | Admit: 2021-11-09 | Discharge: 2021-11-09 | Disposition: A | Discharge status: Home / Self Care | Payer: Medicare Other | Source: Ambulatory Visit | Attending: Family Medicine | Admitting: Family Medicine

## 2021-11-09 DIAGNOSIS — Z1231 Encounter for screening mammogram for malignant neoplasm of breast: Secondary | ICD-10-CM

## 2021-12-08 DIAGNOSIS — H04123 Dry eye syndrome of bilateral lacrimal glands: Secondary | ICD-10-CM | POA: Diagnosis not present

## 2021-12-13 DIAGNOSIS — Z6822 Body mass index (BMI) 22.0-22.9, adult: Secondary | ICD-10-CM | POA: Diagnosis not present

## 2021-12-13 DIAGNOSIS — Z1389 Encounter for screening for other disorder: Secondary | ICD-10-CM | POA: Diagnosis not present

## 2021-12-13 DIAGNOSIS — Z0000 Encounter for general adult medical examination without abnormal findings: Secondary | ICD-10-CM | POA: Diagnosis not present

## 2021-12-13 DIAGNOSIS — Z23 Encounter for immunization: Secondary | ICD-10-CM | POA: Diagnosis not present

## 2021-12-14 DIAGNOSIS — R252 Cramp and spasm: Secondary | ICD-10-CM | POA: Diagnosis not present

## 2021-12-14 DIAGNOSIS — Z6822 Body mass index (BMI) 22.0-22.9, adult: Secondary | ICD-10-CM | POA: Diagnosis not present

## 2021-12-14 DIAGNOSIS — R000 Tachycardia, unspecified: Secondary | ICD-10-CM | POA: Diagnosis not present

## 2021-12-14 DIAGNOSIS — M810 Age-related osteoporosis without current pathological fracture: Secondary | ICD-10-CM | POA: Diagnosis not present

## 2021-12-14 DIAGNOSIS — E785 Hyperlipidemia, unspecified: Secondary | ICD-10-CM | POA: Diagnosis not present

## 2021-12-14 DIAGNOSIS — F325 Major depressive disorder, single episode, in full remission: Secondary | ICD-10-CM | POA: Diagnosis not present

## 2021-12-14 DIAGNOSIS — F5101 Primary insomnia: Secondary | ICD-10-CM | POA: Diagnosis not present

## 2021-12-14 DIAGNOSIS — I10 Essential (primary) hypertension: Secondary | ICD-10-CM | POA: Diagnosis not present

## 2021-12-15 ENCOUNTER — Other Ambulatory Visit: Payer: Self-pay | Admitting: Family Medicine

## 2021-12-15 DIAGNOSIS — F411 Generalized anxiety disorder: Secondary | ICD-10-CM | POA: Diagnosis not present

## 2021-12-15 DIAGNOSIS — M810 Age-related osteoporosis without current pathological fracture: Secondary | ICD-10-CM

## 2021-12-26 DIAGNOSIS — Z23 Encounter for immunization: Secondary | ICD-10-CM | POA: Diagnosis not present

## 2022-01-20 DIAGNOSIS — H04123 Dry eye syndrome of bilateral lacrimal glands: Secondary | ICD-10-CM | POA: Diagnosis not present

## 2022-01-20 DIAGNOSIS — H524 Presbyopia: Secondary | ICD-10-CM | POA: Diagnosis not present

## 2022-01-25 DIAGNOSIS — L814 Other melanin hyperpigmentation: Secondary | ICD-10-CM | POA: Diagnosis not present

## 2022-01-25 DIAGNOSIS — D225 Melanocytic nevi of trunk: Secondary | ICD-10-CM | POA: Diagnosis not present

## 2022-01-25 DIAGNOSIS — D1801 Hemangioma of skin and subcutaneous tissue: Secondary | ICD-10-CM | POA: Diagnosis not present

## 2022-01-25 DIAGNOSIS — F411 Generalized anxiety disorder: Secondary | ICD-10-CM | POA: Diagnosis not present

## 2022-01-25 DIAGNOSIS — Z85828 Personal history of other malignant neoplasm of skin: Secondary | ICD-10-CM | POA: Diagnosis not present

## 2022-01-25 DIAGNOSIS — L821 Other seborrheic keratosis: Secondary | ICD-10-CM | POA: Diagnosis not present

## 2022-02-22 DIAGNOSIS — F411 Generalized anxiety disorder: Secondary | ICD-10-CM | POA: Diagnosis not present

## 2022-03-07 DIAGNOSIS — H04563 Stenosis of bilateral lacrimal punctum: Secondary | ICD-10-CM | POA: Diagnosis not present

## 2022-03-16 DIAGNOSIS — H04563 Stenosis of bilateral lacrimal punctum: Secondary | ICD-10-CM | POA: Diagnosis not present

## 2022-04-06 DIAGNOSIS — F411 Generalized anxiety disorder: Secondary | ICD-10-CM | POA: Diagnosis not present

## 2022-04-18 DIAGNOSIS — S0501XA Injury of conjunctiva and corneal abrasion without foreign body, right eye, initial encounter: Secondary | ICD-10-CM | POA: Diagnosis not present

## 2022-04-21 DIAGNOSIS — S0502XD Injury of conjunctiva and corneal abrasion without foreign body, left eye, subsequent encounter: Secondary | ICD-10-CM | POA: Diagnosis not present

## 2022-05-04 DIAGNOSIS — F5105 Insomnia due to other mental disorder: Secondary | ICD-10-CM | POA: Diagnosis not present

## 2022-05-04 DIAGNOSIS — F411 Generalized anxiety disorder: Secondary | ICD-10-CM | POA: Diagnosis not present

## 2022-05-23 DIAGNOSIS — H2513 Age-related nuclear cataract, bilateral: Secondary | ICD-10-CM | POA: Diagnosis not present

## 2022-05-23 DIAGNOSIS — H04123 Dry eye syndrome of bilateral lacrimal glands: Secondary | ICD-10-CM | POA: Diagnosis not present

## 2022-05-31 ENCOUNTER — Ambulatory Visit
Admission: RE | Admit: 2022-05-31 | Discharge: 2022-05-31 | Disposition: A | Discharge status: Home / Self Care | Payer: Medicare Other | Source: Ambulatory Visit | Attending: Family Medicine | Admitting: Family Medicine

## 2022-05-31 DIAGNOSIS — M810 Age-related osteoporosis without current pathological fracture: Secondary | ICD-10-CM | POA: Diagnosis not present

## 2022-05-31 DIAGNOSIS — Z780 Asymptomatic menopausal state: Secondary | ICD-10-CM | POA: Diagnosis not present

## 2022-06-20 DIAGNOSIS — F5105 Insomnia due to other mental disorder: Secondary | ICD-10-CM | POA: Diagnosis not present

## 2022-06-20 DIAGNOSIS — F411 Generalized anxiety disorder: Secondary | ICD-10-CM | POA: Diagnosis not present

## 2022-06-21 DIAGNOSIS — E785 Hyperlipidemia, unspecified: Secondary | ICD-10-CM | POA: Diagnosis not present

## 2022-06-21 DIAGNOSIS — F325 Major depressive disorder, single episode, in full remission: Secondary | ICD-10-CM | POA: Diagnosis not present

## 2022-06-21 DIAGNOSIS — R000 Tachycardia, unspecified: Secondary | ICD-10-CM | POA: Diagnosis not present

## 2022-06-21 DIAGNOSIS — M810 Age-related osteoporosis without current pathological fracture: Secondary | ICD-10-CM | POA: Diagnosis not present

## 2022-06-21 DIAGNOSIS — Z6823 Body mass index (BMI) 23.0-23.9, adult: Secondary | ICD-10-CM | POA: Diagnosis not present

## 2022-06-21 DIAGNOSIS — I10 Essential (primary) hypertension: Secondary | ICD-10-CM | POA: Diagnosis not present

## 2022-06-23 DIAGNOSIS — Z23 Encounter for immunization: Secondary | ICD-10-CM | POA: Diagnosis not present

## 2022-08-16 DIAGNOSIS — F5105 Insomnia due to other mental disorder: Secondary | ICD-10-CM | POA: Diagnosis not present

## 2022-08-16 DIAGNOSIS — F411 Generalized anxiety disorder: Secondary | ICD-10-CM | POA: Diagnosis not present

## 2022-09-25 DIAGNOSIS — M2041 Other hammer toe(s) (acquired), right foot: Secondary | ICD-10-CM | POA: Diagnosis not present

## 2022-09-25 DIAGNOSIS — M898X9 Other specified disorders of bone, unspecified site: Secondary | ICD-10-CM | POA: Diagnosis not present

## 2022-09-25 DIAGNOSIS — L602 Onychogryphosis: Secondary | ICD-10-CM | POA: Diagnosis not present

## 2022-09-26 ENCOUNTER — Other Ambulatory Visit: Payer: Self-pay | Admitting: Family Medicine

## 2022-09-26 DIAGNOSIS — Z1231 Encounter for screening mammogram for malignant neoplasm of breast: Secondary | ICD-10-CM

## 2022-11-01 DIAGNOSIS — F5105 Insomnia due to other mental disorder: Secondary | ICD-10-CM | POA: Diagnosis not present

## 2022-11-01 DIAGNOSIS — F411 Generalized anxiety disorder: Secondary | ICD-10-CM | POA: Diagnosis not present

## 2022-11-10 DIAGNOSIS — Z6824 Body mass index (BMI) 24.0-24.9, adult: Secondary | ICD-10-CM | POA: Diagnosis not present

## 2022-11-10 DIAGNOSIS — N950 Postmenopausal bleeding: Secondary | ICD-10-CM | POA: Diagnosis not present

## 2022-11-13 ENCOUNTER — Ambulatory Visit: Payer: Medicare Other

## 2022-11-22 ENCOUNTER — Ambulatory Visit
Admission: RE | Admit: 2022-11-22 | Discharge: 2022-11-22 | Disposition: A | Discharge status: Home / Self Care | Payer: Medicare Other | Source: Ambulatory Visit | Attending: Family Medicine | Admitting: Family Medicine

## 2022-11-22 DIAGNOSIS — Z1231 Encounter for screening mammogram for malignant neoplasm of breast: Secondary | ICD-10-CM

## 2022-12-01 ENCOUNTER — Other Ambulatory Visit: Payer: Self-pay | Admitting: Medical Genetics

## 2022-12-01 DIAGNOSIS — Z006 Encounter for examination for normal comparison and control in clinical research program: Secondary | ICD-10-CM

## 2022-12-20 DIAGNOSIS — Z0000 Encounter for general adult medical examination without abnormal findings: Secondary | ICD-10-CM | POA: Diagnosis not present

## 2022-12-20 DIAGNOSIS — Z6824 Body mass index (BMI) 24.0-24.9, adult: Secondary | ICD-10-CM | POA: Diagnosis not present

## 2022-12-20 DIAGNOSIS — Z23 Encounter for immunization: Secondary | ICD-10-CM | POA: Diagnosis not present

## 2022-12-27 DIAGNOSIS — Z23 Encounter for immunization: Secondary | ICD-10-CM | POA: Diagnosis not present

## 2022-12-27 DIAGNOSIS — I10 Essential (primary) hypertension: Secondary | ICD-10-CM | POA: Diagnosis not present

## 2022-12-27 DIAGNOSIS — Z6824 Body mass index (BMI) 24.0-24.9, adult: Secondary | ICD-10-CM | POA: Diagnosis not present

## 2022-12-27 DIAGNOSIS — M810 Age-related osteoporosis without current pathological fracture: Secondary | ICD-10-CM | POA: Diagnosis not present

## 2022-12-27 DIAGNOSIS — E785 Hyperlipidemia, unspecified: Secondary | ICD-10-CM | POA: Diagnosis not present

## 2022-12-31 DIAGNOSIS — Z23 Encounter for immunization: Secondary | ICD-10-CM | POA: Diagnosis not present

## 2023-01-23 DIAGNOSIS — H04123 Dry eye syndrome of bilateral lacrimal glands: Secondary | ICD-10-CM | POA: Diagnosis not present

## 2023-01-23 DIAGNOSIS — H524 Presbyopia: Secondary | ICD-10-CM | POA: Diagnosis not present

## 2023-01-23 DIAGNOSIS — H2513 Age-related nuclear cataract, bilateral: Secondary | ICD-10-CM | POA: Diagnosis not present

## 2023-01-24 DIAGNOSIS — F5105 Insomnia due to other mental disorder: Secondary | ICD-10-CM | POA: Diagnosis not present

## 2023-01-24 DIAGNOSIS — F411 Generalized anxiety disorder: Secondary | ICD-10-CM | POA: Diagnosis not present

## 2023-01-30 DIAGNOSIS — D225 Melanocytic nevi of trunk: Secondary | ICD-10-CM | POA: Diagnosis not present

## 2023-01-30 DIAGNOSIS — D1801 Hemangioma of skin and subcutaneous tissue: Secondary | ICD-10-CM | POA: Diagnosis not present

## 2023-01-30 DIAGNOSIS — Z85828 Personal history of other malignant neoplasm of skin: Secondary | ICD-10-CM | POA: Diagnosis not present

## 2023-01-30 DIAGNOSIS — L821 Other seborrheic keratosis: Secondary | ICD-10-CM | POA: Diagnosis not present

## 2023-01-30 DIAGNOSIS — D2272 Melanocytic nevi of left lower limb, including hip: Secondary | ICD-10-CM | POA: Diagnosis not present

## 2023-01-30 DIAGNOSIS — L814 Other melanin hyperpigmentation: Secondary | ICD-10-CM | POA: Diagnosis not present

## 2023-03-06 DIAGNOSIS — H04123 Dry eye syndrome of bilateral lacrimal glands: Secondary | ICD-10-CM | POA: Diagnosis not present

## 2023-09-03 ENCOUNTER — Other Ambulatory Visit (HOSPITAL_COMMUNITY)
Admission: RE | Admit: 2023-09-03 | Discharge: 2023-09-03 | Disposition: A | Discharge status: Home / Self Care | Payer: Self-pay | Source: Ambulatory Visit | Attending: Medical Genetics | Admitting: Medical Genetics

## 2023-09-03 DIAGNOSIS — Z006 Encounter for examination for normal comparison and control in clinical research program: Secondary | ICD-10-CM | POA: Insufficient documentation

## 2023-09-12 LAB — GENECONNECT MOLECULAR SCREEN: Genetic Analysis Overall Interpretation: NEGATIVE

## 2023-10-09 ENCOUNTER — Other Ambulatory Visit: Payer: Self-pay | Admitting: Family Medicine

## 2023-10-09 DIAGNOSIS — Z1231 Encounter for screening mammogram for malignant neoplasm of breast: Secondary | ICD-10-CM

## 2023-11-28 ENCOUNTER — Ambulatory Visit
Admission: RE | Admit: 2023-11-28 | Discharge: 2023-11-28 | Disposition: A | Discharge status: Home / Self Care | Source: Ambulatory Visit | Attending: Family Medicine | Admitting: Family Medicine

## 2023-11-28 DIAGNOSIS — Z1231 Encounter for screening mammogram for malignant neoplasm of breast: Secondary | ICD-10-CM
